# Patient Record
Sex: Female | Born: 1955 | Race: Black or African American | Hispanic: No | Marital: Married | Smoking: Never smoker
Health system: Southern US, Community
[De-identification: ages and names within clinical notes are randomized; demographics above are authoritative.]

## PROBLEM LIST (undated history)

## (undated) DIAGNOSIS — D869 Sarcoidosis, unspecified: Secondary | ICD-10-CM

## (undated) DIAGNOSIS — K529 Noninfective gastroenteritis and colitis, unspecified: Secondary | ICD-10-CM

## (undated) DIAGNOSIS — Z01419 Encounter for gynecological examination (general) (routine) without abnormal findings: Secondary | ICD-10-CM

## (undated) DIAGNOSIS — I10 Essential (primary) hypertension: Secondary | ICD-10-CM

## (undated) DIAGNOSIS — M4712 Other spondylosis with myelopathy, cervical region: Secondary | ICD-10-CM

## (undated) DIAGNOSIS — F329 Major depressive disorder, single episode, unspecified: Secondary | ICD-10-CM

## (undated) DIAGNOSIS — Z0100 Encounter for examination of eyes and vision without abnormal findings: Secondary | ICD-10-CM

## (undated) DIAGNOSIS — F32A Depression, unspecified: Secondary | ICD-10-CM

## (undated) DIAGNOSIS — K219 Gastro-esophageal reflux disease without esophagitis: Secondary | ICD-10-CM

## (undated) DIAGNOSIS — F419 Anxiety disorder, unspecified: Secondary | ICD-10-CM

## (undated) HISTORY — PX: TOTAL ABDOMINAL HYSTERECTOMY: SHX209

## (undated) HISTORY — DX: Gastro-esophageal reflux disease without esophagitis: K21.9

## (undated) HISTORY — DX: Sarcoidosis, unspecified: D86.9

## (undated) HISTORY — DX: Essential (primary) hypertension: I10

## (undated) HISTORY — DX: Major depressive disorder, single episode, unspecified: F32.9

## (undated) HISTORY — DX: Depression, unspecified: F32.A

## (undated) HISTORY — DX: Encounter for examination of eyes and vision without abnormal findings: Z01.00

## (undated) HISTORY — DX: Anxiety disorder, unspecified: F41.9

## (undated) HISTORY — DX: Noninfective gastroenteritis and colitis, unspecified: K52.9

## (undated) HISTORY — DX: Other spondylosis with myelopathy, cervical region: M47.12

## (undated) HISTORY — DX: Encounter for gynecological examination (general) (routine) without abnormal findings: Z01.419

## (undated) HISTORY — PX: OOPHORECTOMY: SHX86

---

## 2000-10-22 ENCOUNTER — Encounter: Payer: Self-pay | Admitting: Family Medicine

## 2000-10-22 ENCOUNTER — Encounter: Admission: RE | Admit: 2000-10-22 | Discharge: 2000-10-22 | Payer: Self-pay | Admitting: Family Medicine

## 2000-10-23 ENCOUNTER — Encounter: Admission: RE | Admit: 2000-10-23 | Discharge: 2000-10-23 | Payer: Self-pay | Admitting: Family Medicine

## 2000-10-23 ENCOUNTER — Encounter: Payer: Self-pay | Admitting: Family Medicine

## 2000-11-11 ENCOUNTER — Encounter: Payer: Self-pay | Admitting: General Surgery

## 2000-11-11 ENCOUNTER — Ambulatory Visit (HOSPITAL_COMMUNITY): Admission: RE | Admit: 2000-11-11 | Discharge: 2000-11-11 | Payer: Self-pay | Admitting: General Surgery

## 2000-11-13 ENCOUNTER — Encounter: Payer: Self-pay | Admitting: Emergency Medicine

## 2000-11-13 ENCOUNTER — Ambulatory Visit (HOSPITAL_COMMUNITY): Admission: RE | Admit: 2000-11-13 | Discharge: 2000-11-13 | Payer: Self-pay | Admitting: General Surgery

## 2000-11-13 ENCOUNTER — Emergency Department (HOSPITAL_COMMUNITY): Admission: EM | Admit: 2000-11-13 | Discharge: 2000-11-13 | Payer: Self-pay | Admitting: Emergency Medicine

## 2000-11-13 ENCOUNTER — Encounter: Payer: Self-pay | Admitting: General Surgery

## 2000-11-20 ENCOUNTER — Encounter (INDEPENDENT_AMBULATORY_CARE_PROVIDER_SITE_OTHER): Payer: Self-pay | Admitting: Specialist

## 2000-11-20 ENCOUNTER — Ambulatory Visit: Admission: RE | Admit: 2000-11-20 | Discharge: 2000-11-20 | Payer: Self-pay | Admitting: Internal Medicine

## 2000-11-20 ENCOUNTER — Encounter (INDEPENDENT_AMBULATORY_CARE_PROVIDER_SITE_OTHER): Payer: Self-pay

## 2000-12-03 ENCOUNTER — Ambulatory Visit (HOSPITAL_COMMUNITY): Admission: RE | Admit: 2000-12-03 | Discharge: 2000-12-03 | Payer: Self-pay | Admitting: Cardiology

## 2001-02-04 ENCOUNTER — Other Ambulatory Visit: Admission: RE | Admit: 2001-02-04 | Discharge: 2001-02-04 | Payer: Self-pay | Admitting: Gynecology

## 2003-01-27 ENCOUNTER — Other Ambulatory Visit: Admission: RE | Admit: 2003-01-27 | Discharge: 2003-01-27 | Payer: Self-pay | Admitting: Gynecology

## 2003-02-07 ENCOUNTER — Observation Stay (HOSPITAL_COMMUNITY): Admission: AD | Admit: 2003-02-07 | Discharge: 2003-02-08 | Payer: Self-pay | Admitting: Gynecology

## 2003-02-07 ENCOUNTER — Encounter: Payer: Self-pay | Admitting: Gynecology

## 2003-02-15 ENCOUNTER — Ambulatory Visit (HOSPITAL_COMMUNITY): Admission: RE | Admit: 2003-02-15 | Discharge: 2003-02-15 | Payer: Self-pay | Admitting: Gynecology

## 2003-02-15 ENCOUNTER — Encounter: Payer: Self-pay | Admitting: Gynecology

## 2003-03-01 ENCOUNTER — Encounter (INDEPENDENT_AMBULATORY_CARE_PROVIDER_SITE_OTHER): Payer: Self-pay | Admitting: Specialist

## 2003-03-01 ENCOUNTER — Inpatient Hospital Stay (HOSPITAL_COMMUNITY): Admission: AD | Admit: 2003-03-01 | Discharge: 2003-03-03 | Payer: Self-pay | Admitting: Gynecology

## 2004-03-06 ENCOUNTER — Other Ambulatory Visit: Admission: RE | Admit: 2004-03-06 | Discharge: 2004-03-06 | Payer: Self-pay | Admitting: Gynecology

## 2004-03-08 ENCOUNTER — Encounter: Admission: RE | Admit: 2004-03-08 | Discharge: 2004-03-08 | Payer: Self-pay | Admitting: Gynecology

## 2004-03-19 ENCOUNTER — Encounter: Admission: RE | Admit: 2004-03-19 | Discharge: 2004-03-19 | Payer: Self-pay | Admitting: Gynecology

## 2004-05-02 ENCOUNTER — Encounter: Admission: RE | Admit: 2004-05-02 | Discharge: 2004-05-02 | Payer: Self-pay | Admitting: General Surgery

## 2004-05-03 ENCOUNTER — Encounter: Admission: RE | Admit: 2004-05-03 | Discharge: 2004-05-03 | Payer: Self-pay | Admitting: General Surgery

## 2004-05-03 ENCOUNTER — Ambulatory Visit (HOSPITAL_BASED_OUTPATIENT_CLINIC_OR_DEPARTMENT_OTHER): Admission: RE | Admit: 2004-05-03 | Discharge: 2004-05-03 | Payer: Self-pay | Admitting: General Surgery

## 2004-05-03 ENCOUNTER — Encounter (INDEPENDENT_AMBULATORY_CARE_PROVIDER_SITE_OTHER): Payer: Self-pay | Admitting: *Deleted

## 2004-05-03 ENCOUNTER — Ambulatory Visit (HOSPITAL_COMMUNITY): Admission: RE | Admit: 2004-05-03 | Discharge: 2004-05-03 | Payer: Self-pay | Admitting: General Surgery

## 2004-07-03 ENCOUNTER — Encounter: Admission: RE | Admit: 2004-07-03 | Discharge: 2004-07-03 | Payer: Self-pay | Admitting: Unknown Physician Specialty

## 2004-08-31 ENCOUNTER — Encounter: Admission: RE | Admit: 2004-08-31 | Discharge: 2004-08-31 | Payer: Self-pay | Admitting: Unknown Physician Specialty

## 2005-04-08 ENCOUNTER — Ambulatory Visit: Payer: Self-pay | Admitting: Internal Medicine

## 2005-04-30 ENCOUNTER — Ambulatory Visit: Payer: Self-pay | Admitting: Internal Medicine

## 2005-05-14 ENCOUNTER — Other Ambulatory Visit: Admission: RE | Admit: 2005-05-14 | Discharge: 2005-05-14 | Payer: Self-pay | Admitting: Gynecology

## 2006-01-13 ENCOUNTER — Ambulatory Visit: Payer: Self-pay | Admitting: Family Medicine

## 2006-02-11 ENCOUNTER — Ambulatory Visit: Payer: Self-pay | Admitting: Family Medicine

## 2006-02-28 ENCOUNTER — Ambulatory Visit: Payer: Self-pay | Admitting: Gastroenterology

## 2006-03-12 ENCOUNTER — Ambulatory Visit: Payer: Self-pay | Admitting: Gastroenterology

## 2006-05-19 ENCOUNTER — Ambulatory Visit: Payer: Self-pay | Admitting: Family Medicine

## 2006-08-12 ENCOUNTER — Ambulatory Visit: Payer: Self-pay | Admitting: Family Medicine

## 2006-12-22 ENCOUNTER — Ambulatory Visit: Payer: Self-pay | Admitting: Family Medicine

## 2007-01-08 ENCOUNTER — Other Ambulatory Visit: Admission: RE | Admit: 2007-01-08 | Discharge: 2007-01-08 | Payer: Self-pay | Admitting: Gynecology

## 2007-01-19 ENCOUNTER — Encounter: Admission: RE | Admit: 2007-01-19 | Discharge: 2007-01-19 | Payer: Self-pay | Admitting: Family Medicine

## 2007-01-21 ENCOUNTER — Telehealth: Payer: Self-pay | Admitting: Family Medicine

## 2007-04-07 ENCOUNTER — Encounter: Admission: RE | Admit: 2007-04-07 | Discharge: 2007-04-07 | Payer: Self-pay | Admitting: Family Medicine

## 2007-04-08 ENCOUNTER — Telehealth: Payer: Self-pay | Admitting: Family Medicine

## 2007-06-18 HISTORY — PX: OTHER SURGICAL HISTORY: SHX169

## 2007-08-04 ENCOUNTER — Ambulatory Visit: Payer: Self-pay | Admitting: Family Medicine

## 2007-08-04 DIAGNOSIS — B373 Candidiasis of vulva and vagina: Secondary | ICD-10-CM | POA: Insufficient documentation

## 2007-08-04 DIAGNOSIS — N643 Galactorrhea not associated with childbirth: Secondary | ICD-10-CM | POA: Insufficient documentation

## 2007-08-04 DIAGNOSIS — F329 Major depressive disorder, single episode, unspecified: Secondary | ICD-10-CM | POA: Insufficient documentation

## 2007-08-04 DIAGNOSIS — F418 Other specified anxiety disorders: Secondary | ICD-10-CM | POA: Insufficient documentation

## 2007-08-04 DIAGNOSIS — I10 Essential (primary) hypertension: Secondary | ICD-10-CM | POA: Insufficient documentation

## 2007-08-04 LAB — CONVERTED CEMR LAB
Bilirubin Urine: NEGATIVE
Blood in Urine, dipstick: NEGATIVE
Glucose, Urine, Semiquant: NEGATIVE
Ketones, urine, test strip: NEGATIVE
Nitrite: NEGATIVE
Protein, U semiquant: NEGATIVE
Specific Gravity, Urine: 1.01
Urobilinogen, UA: 0.2
pH: 6.5

## 2007-09-18 ENCOUNTER — Telehealth: Payer: Self-pay | Admitting: Family Medicine

## 2008-01-20 ENCOUNTER — Encounter: Payer: Self-pay | Admitting: Family Medicine

## 2008-02-04 ENCOUNTER — Encounter: Payer: Self-pay | Admitting: Family Medicine

## 2008-03-08 ENCOUNTER — Ambulatory Visit: Payer: Self-pay | Admitting: Family Medicine

## 2008-03-08 DIAGNOSIS — D869 Sarcoidosis, unspecified: Secondary | ICD-10-CM | POA: Insufficient documentation

## 2008-03-24 ENCOUNTER — Telehealth: Payer: Self-pay | Admitting: Gastroenterology

## 2008-03-24 ENCOUNTER — Ambulatory Visit: Payer: Self-pay | Admitting: Internal Medicine

## 2008-03-24 DIAGNOSIS — K625 Hemorrhage of anus and rectum: Secondary | ICD-10-CM | POA: Insufficient documentation

## 2008-03-24 DIAGNOSIS — K5730 Diverticulosis of large intestine without perforation or abscess without bleeding: Secondary | ICD-10-CM | POA: Insufficient documentation

## 2008-03-28 LAB — CONVERTED CEMR LAB
BUN: 26 mg/dL — ABNORMAL HIGH (ref 6–23)
Basophils Absolute: 0 10*3/uL (ref 0.0–0.1)
Basophils Relative: 0.6 % (ref 0.0–3.0)
CO2: 28 meq/L (ref 19–32)
Calcium: 9.9 mg/dL (ref 8.4–10.5)
Chloride: 103 meq/L (ref 96–112)
Creatinine, Ser: 1.4 mg/dL — ABNORMAL HIGH (ref 0.4–1.2)
Eosinophils Absolute: 0.1 10*3/uL (ref 0.0–0.7)
Eosinophils Relative: 2.1 % (ref 0.0–5.0)
GFR calc Af Amer: 51 mL/min
GFR calc non Af Amer: 42 mL/min
Glucose, Bld: 93 mg/dL (ref 70–99)
HCT: 38.1 % (ref 36.0–46.0)
Hemoglobin: 13.1 g/dL (ref 12.0–15.0)
Lymphocytes Relative: 20.2 % (ref 12.0–46.0)
MCHC: 34.5 g/dL (ref 30.0–36.0)
MCV: 83.1 fL (ref 78.0–100.0)
Monocytes Absolute: 0.8 10*3/uL (ref 0.1–1.0)
Monocytes Relative: 12.3 % — ABNORMAL HIGH (ref 3.0–12.0)
Neutro Abs: 4 10*3/uL (ref 1.4–7.7)
Neutrophils Relative %: 64.8 % (ref 43.0–77.0)
Platelets: 212 10*3/uL (ref 150–400)
Potassium: 3.6 meq/L (ref 3.5–5.1)
RBC: 4.58 M/uL (ref 3.87–5.11)
RDW: 12.9 % (ref 11.5–14.6)
Sodium: 140 meq/L (ref 135–145)
WBC: 6.2 10*3/uL (ref 4.5–10.5)

## 2008-04-06 ENCOUNTER — Ambulatory Visit: Payer: Self-pay | Admitting: Gastroenterology

## 2008-04-06 ENCOUNTER — Telehealth: Payer: Self-pay | Admitting: Gastroenterology

## 2008-04-06 LAB — CONVERTED CEMR LAB
BUN: 25 mg/dL — ABNORMAL HIGH (ref 6–23)
CO2: 33 meq/L — ABNORMAL HIGH (ref 19–32)
Calcium: 10.3 mg/dL (ref 8.4–10.5)
Chloride: 101 meq/L (ref 96–112)
Creatinine, Ser: 1.2 mg/dL (ref 0.4–1.2)
GFR calc Af Amer: 60 mL/min
GFR calc non Af Amer: 50 mL/min
Glucose, Bld: 97 mg/dL (ref 70–99)
Potassium: 4.1 meq/L (ref 3.5–5.1)
Sodium: 141 meq/L (ref 135–145)

## 2008-04-07 ENCOUNTER — Ambulatory Visit: Payer: Self-pay | Admitting: Cardiology

## 2008-04-08 ENCOUNTER — Ambulatory Visit: Payer: Self-pay | Admitting: Gastroenterology

## 2008-04-12 ENCOUNTER — Encounter: Payer: Self-pay | Admitting: Gastroenterology

## 2008-04-12 ENCOUNTER — Ambulatory Visit: Payer: Self-pay | Admitting: Gastroenterology

## 2008-04-15 ENCOUNTER — Encounter: Payer: Self-pay | Admitting: Gastroenterology

## 2008-04-28 ENCOUNTER — Ambulatory Visit: Payer: Self-pay | Admitting: Critical Care Medicine

## 2008-04-28 DIAGNOSIS — K219 Gastro-esophageal reflux disease without esophagitis: Secondary | ICD-10-CM | POA: Insufficient documentation

## 2008-05-04 ENCOUNTER — Ambulatory Visit: Payer: Self-pay | Admitting: Gastroenterology

## 2008-05-23 ENCOUNTER — Ambulatory Visit: Payer: Self-pay | Admitting: Gastroenterology

## 2008-07-01 ENCOUNTER — Ambulatory Visit: Payer: Self-pay | Admitting: Critical Care Medicine

## 2008-07-01 DIAGNOSIS — J310 Chronic rhinitis: Secondary | ICD-10-CM | POA: Insufficient documentation

## 2008-08-29 ENCOUNTER — Telehealth: Payer: Self-pay | Admitting: Family Medicine

## 2008-08-30 ENCOUNTER — Ambulatory Visit: Payer: Self-pay | Admitting: Family Medicine

## 2008-09-05 ENCOUNTER — Encounter: Payer: Self-pay | Admitting: Family Medicine

## 2008-10-18 ENCOUNTER — Encounter (INDEPENDENT_AMBULATORY_CARE_PROVIDER_SITE_OTHER): Payer: Self-pay | Admitting: *Deleted

## 2008-11-03 ENCOUNTER — Ambulatory Visit: Payer: Self-pay | Admitting: Family Medicine

## 2008-11-04 ENCOUNTER — Encounter: Payer: Self-pay | Admitting: Family Medicine

## 2008-11-04 LAB — CONVERTED CEMR LAB
ALT: 34 units/L (ref 0–35)
AST: 31 units/L (ref 0–37)
Albumin: 3.9 g/dL (ref 3.5–5.2)
Alkaline Phosphatase: 128 units/L — ABNORMAL HIGH (ref 39–117)
BUN: 19 mg/dL (ref 6–23)
Basophils Absolute: 0 10*3/uL (ref 0.0–0.1)
Basophils Relative: 0.5 % (ref 0.0–3.0)
Bilirubin, Direct: 0.1 mg/dL (ref 0.0–0.3)
CO2: 31 meq/L (ref 19–32)
Calcium: 9.5 mg/dL (ref 8.4–10.5)
Chloride: 105 meq/L (ref 96–112)
Creatinine, Ser: 1.1 mg/dL (ref 0.4–1.2)
Eosinophils Absolute: 0.2 10*3/uL (ref 0.0–0.7)
Eosinophils Relative: 3.5 % (ref 0.0–5.0)
GFR calc non Af Amer: 66.54 mL/min (ref >60)
Glucose, Bld: 81 mg/dL (ref 70–99)
HCT: 39.3 % (ref 36.0–46.0)
Hemoglobin: 13.7 g/dL (ref 12.0–15.0)
Lymphocytes Relative: 26 % (ref 12.0–46.0)
Lymphs Abs: 1.2 10*3/uL (ref 0.7–4.0)
MCHC: 34.9 g/dL (ref 30.0–36.0)
MCV: 85.2 fL (ref 78.0–100.0)
Monocytes Absolute: 0.5 10*3/uL (ref 0.1–1.0)
Monocytes Relative: 10.3 % (ref 3.0–12.0)
Neutro Abs: 2.8 10*3/uL (ref 1.4–7.7)
Neutrophils Relative %: 59.7 % (ref 43.0–77.0)
Platelets: 187 10*3/uL (ref 150.0–400.0)
Potassium: 3.5 meq/L (ref 3.5–5.1)
RBC: 4.62 M/uL (ref 3.87–5.11)
RDW: 14.6 % (ref 11.5–14.6)
Sodium: 143 meq/L (ref 135–145)
TSH: 0.89 microintl units/mL (ref 0.35–5.50)
Total Bilirubin: 0.6 mg/dL (ref 0.3–1.2)
Total Protein: 7.3 g/dL (ref 6.0–8.3)
Vitamin B-12: 457 pg/mL (ref 211–911)
WBC: 4.7 10*3/uL (ref 4.5–10.5)

## 2008-12-07 ENCOUNTER — Encounter: Payer: Self-pay | Admitting: Family Medicine

## 2009-02-07 ENCOUNTER — Ambulatory Visit: Payer: Self-pay | Admitting: Family Medicine

## 2009-02-07 DIAGNOSIS — R609 Edema, unspecified: Secondary | ICD-10-CM | POA: Insufficient documentation

## 2009-02-21 ENCOUNTER — Ambulatory Visit: Payer: Self-pay | Admitting: Family Medicine

## 2009-02-21 ENCOUNTER — Encounter: Admission: RE | Admit: 2009-02-21 | Discharge: 2009-02-21 | Payer: Self-pay | Admitting: Family Medicine

## 2009-02-21 LAB — CONVERTED CEMR LAB
Bilirubin Urine: NEGATIVE
Blood in Urine, dipstick: NEGATIVE
Glucose, Urine, Semiquant: NEGATIVE
Ketones, urine, test strip: NEGATIVE
Nitrite: NEGATIVE
Specific Gravity, Urine: 1.025
Urobilinogen, UA: 0.2
WBC Urine, dipstick: NEGATIVE
pH: 5.5

## 2009-02-22 ENCOUNTER — Encounter: Payer: Self-pay | Admitting: Family Medicine

## 2009-02-22 LAB — CONVERTED CEMR LAB
ALT: 36 units/L — ABNORMAL HIGH (ref 0–35)
AST: 33 units/L (ref 0–37)
Albumin: 3.7 g/dL (ref 3.5–5.2)
Alkaline Phosphatase: 143 units/L — ABNORMAL HIGH (ref 39–117)
BUN: 20 mg/dL (ref 6–23)
Basophils Absolute: 0 10*3/uL (ref 0.0–0.1)
Basophils Relative: 0.7 % (ref 0.0–3.0)
Bilirubin, Direct: 0.1 mg/dL (ref 0.0–0.3)
CO2: 30 meq/L (ref 19–32)
Calcium: 9.3 mg/dL (ref 8.4–10.5)
Chloride: 108 meq/L (ref 96–112)
Creatinine, Ser: 1.1 mg/dL (ref 0.4–1.2)
Eosinophils Absolute: 0.2 10*3/uL (ref 0.0–0.7)
Eosinophils Relative: 4.5 % (ref 0.0–5.0)
GFR calc non Af Amer: 66.46 mL/min (ref >60)
Glucose, Bld: 102 mg/dL — ABNORMAL HIGH (ref 70–99)
HCT: 36.9 % (ref 36.0–46.0)
Hemoglobin: 12.5 g/dL (ref 12.0–15.0)
Lymphocytes Relative: 20.3 % (ref 12.0–46.0)
Lymphs Abs: 0.9 10*3/uL (ref 0.7–4.0)
MCHC: 33.8 g/dL (ref 30.0–36.0)
MCV: 87.9 fL (ref 78.0–100.0)
Monocytes Absolute: 0.6 10*3/uL (ref 0.1–1.0)
Monocytes Relative: 14 % — ABNORMAL HIGH (ref 3.0–12.0)
Neutro Abs: 2.5 10*3/uL (ref 1.4–7.7)
Neutrophils Relative %: 60.5 % (ref 43.0–77.0)
Platelets: 181 10*3/uL (ref 150.0–400.0)
Potassium: 3.5 meq/L (ref 3.5–5.1)
Pro B Natriuretic peptide (BNP): 22 pg/mL (ref 0.0–100.0)
RBC: 4.2 M/uL (ref 3.87–5.11)
RDW: 13.2 % (ref 11.5–14.6)
Sodium: 144 meq/L (ref 135–145)
TSH: 1.74 microintl units/mL (ref 0.35–5.50)
Total Bilirubin: 0.8 mg/dL (ref 0.3–1.2)
Total Protein: 6.7 g/dL (ref 6.0–8.3)
WBC: 4.2 10*3/uL — ABNORMAL LOW (ref 4.5–10.5)

## 2009-02-27 ENCOUNTER — Ambulatory Visit: Payer: Self-pay | Admitting: Family Medicine

## 2009-03-08 ENCOUNTER — Ambulatory Visit: Payer: Self-pay | Admitting: Family Medicine

## 2009-03-10 ENCOUNTER — Encounter: Payer: Self-pay | Admitting: Family Medicine

## 2009-03-21 ENCOUNTER — Encounter: Payer: Self-pay | Admitting: Family Medicine

## 2009-07-04 ENCOUNTER — Ambulatory Visit: Payer: Self-pay | Admitting: Gynecology

## 2009-07-04 ENCOUNTER — Other Ambulatory Visit: Admission: RE | Admit: 2009-07-04 | Discharge: 2009-07-04 | Payer: Self-pay | Admitting: Gynecology

## 2009-08-08 ENCOUNTER — Ambulatory Visit: Payer: Self-pay | Admitting: Gastroenterology

## 2009-08-08 LAB — CONVERTED CEMR LAB
Basophils Absolute: 0 10*3/uL (ref 0.0–0.1)
Basophils Relative: 0.5 % (ref 0.0–3.0)
Eosinophils Absolute: 0.1 10*3/uL (ref 0.0–0.7)
Eosinophils Relative: 1.8 % (ref 0.0–5.0)
HCT: 41.4 % (ref 36.0–46.0)
Hemoglobin: 13.4 g/dL (ref 12.0–15.0)
Lymphocytes Relative: 22.1 % (ref 12.0–46.0)
Lymphs Abs: 1.5 10*3/uL (ref 0.7–4.0)
MCHC: 32.4 g/dL (ref 30.0–36.0)
MCV: 84.4 fL (ref 78.0–100.0)
Monocytes Absolute: 0.7 10*3/uL (ref 0.1–1.0)
Monocytes Relative: 11 % (ref 3.0–12.0)
Neutro Abs: 4.4 10*3/uL (ref 1.4–7.7)
Neutrophils Relative %: 64.6 % (ref 43.0–77.0)
Platelets: 229 10*3/uL (ref 150.0–400.0)
RBC: 4.91 M/uL (ref 3.87–5.11)
RDW: 14.9 % — ABNORMAL HIGH (ref 11.5–14.6)
WBC: 6.7 10*3/uL (ref 4.5–10.5)

## 2009-08-09 ENCOUNTER — Ambulatory Visit: Payer: Self-pay | Admitting: Gastroenterology

## 2009-08-09 HISTORY — PX: COLONOSCOPY: SHX174

## 2009-09-26 ENCOUNTER — Ambulatory Visit: Payer: Self-pay | Admitting: Family Medicine

## 2009-10-16 IMAGING — CT CT PARANASAL SINUSES LIMITED
1 series · 16 of 20 positions shown, 20 images · IV contrast (agent unspecified)
Comparison: CT sinus, 04/30/05.

CLINICAL DATA: Recurrent sinusitis symptoms, four rounds of antibiotics.  Headaches, sinus drainage, facial swelling.
LIMITED CT OF THE PARANASAL SINUSES WITHOUT CONTRAST:
TECHNIQUE: Limited coronal CT images were obtained through the paranasal sinuses without intravenous contrast.

[Series 2: limited sinus prone · axial · 0.33mm/px · z∈[+48,+133]mm · 16 of 20 slices shown, 20 images]
[im 2/20  brain]
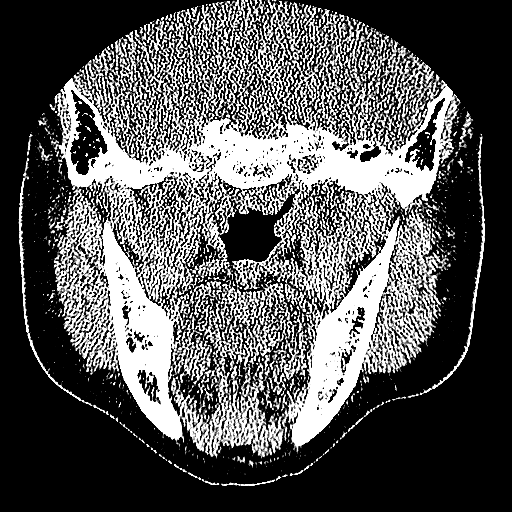
[im 2/20  bone]
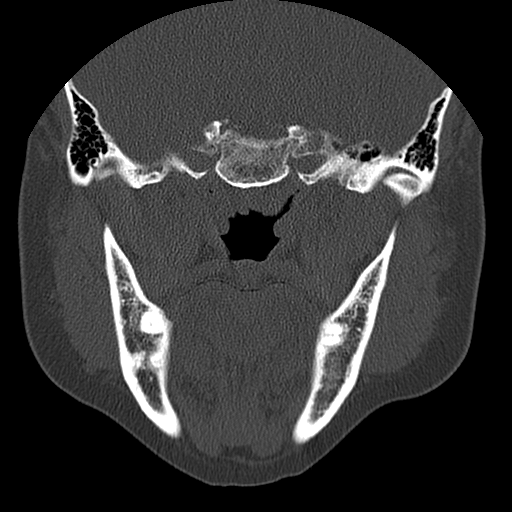
[im 3/20  bone]
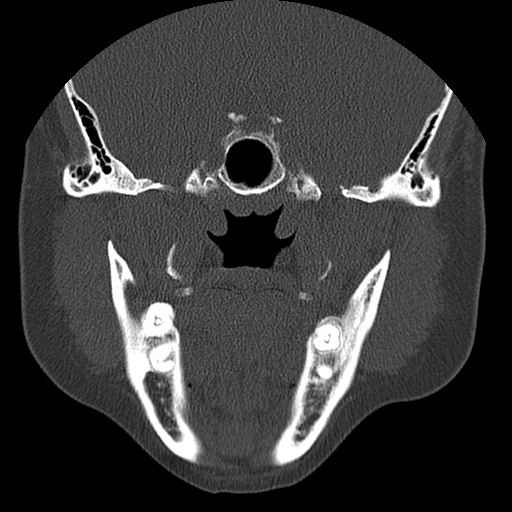
[im 4/20  bone]
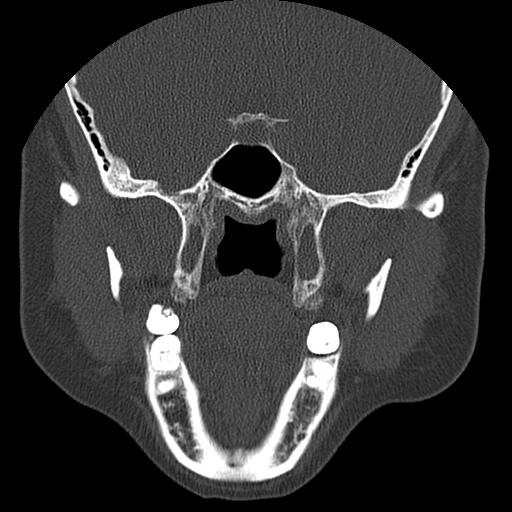
[im 5/20  bone]
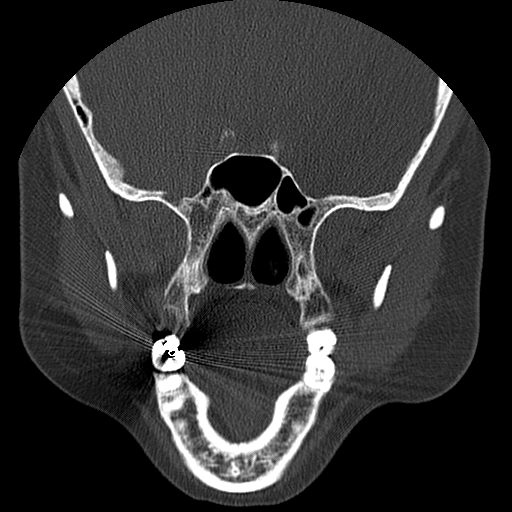
[im 7/20  brain]
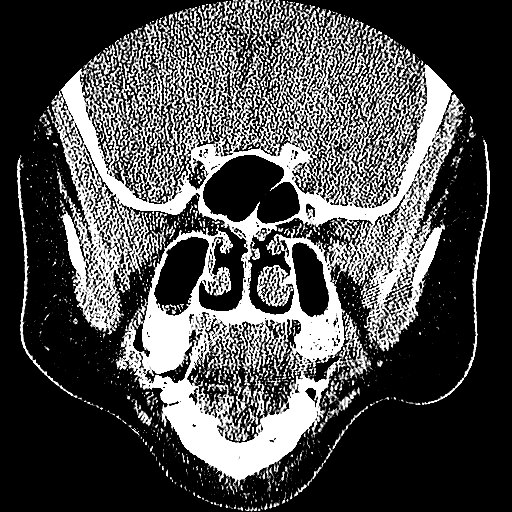
[im 7/20  bone]
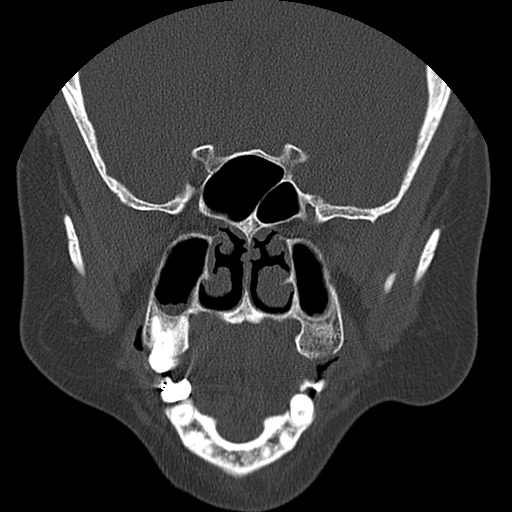
[im 8/20  bone]
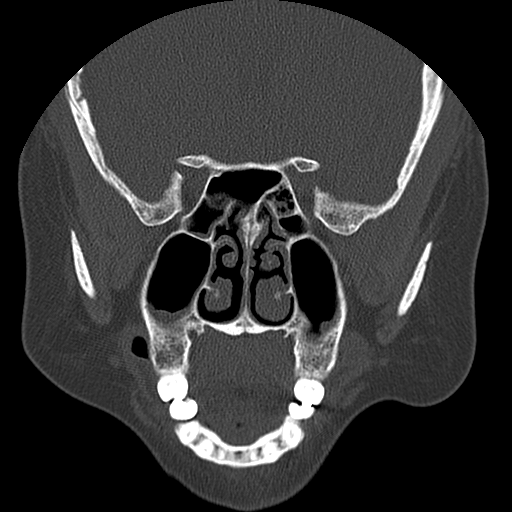
[im 9/20  bone]
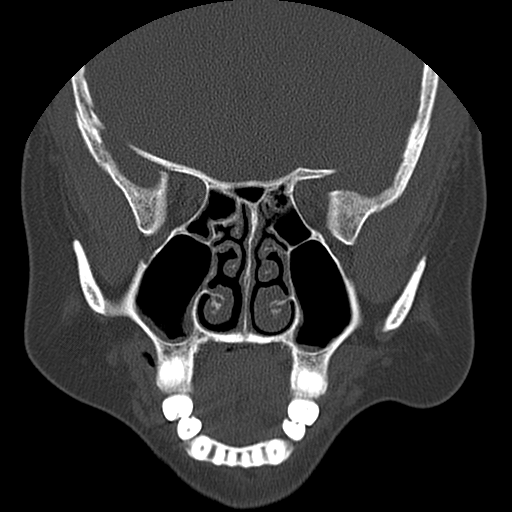
[im 10/20  bone]
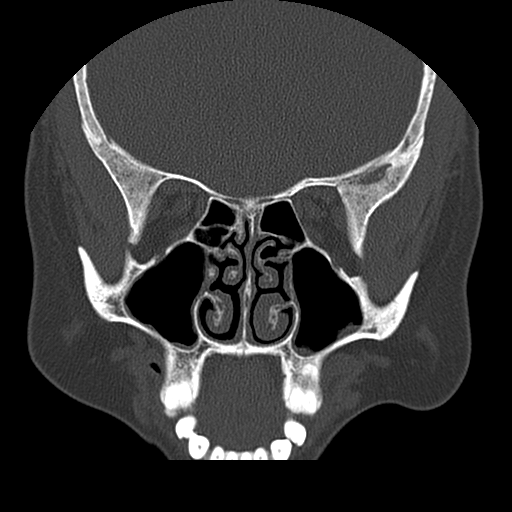
[im 11/20  brain]
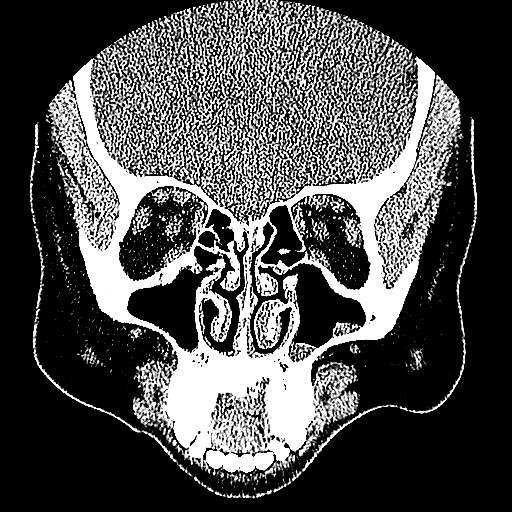
[im 11/20  bone]
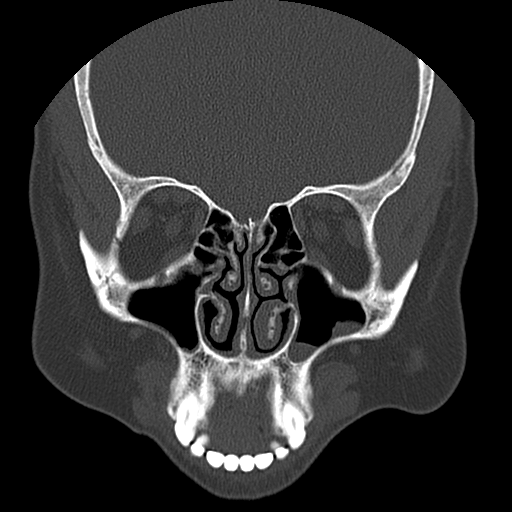
[im 12/20  bone]
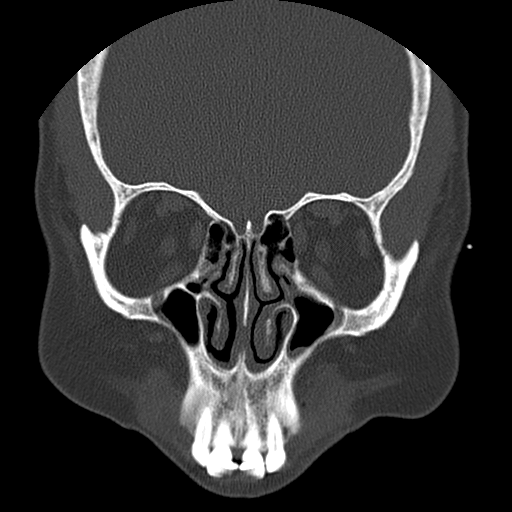
[im 13/20  bone]
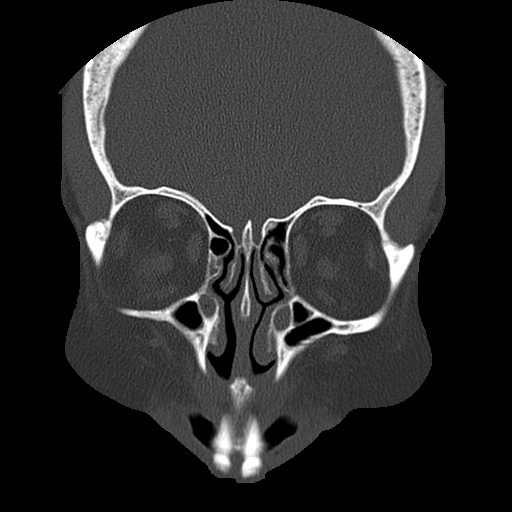
[im 14/20  bone]
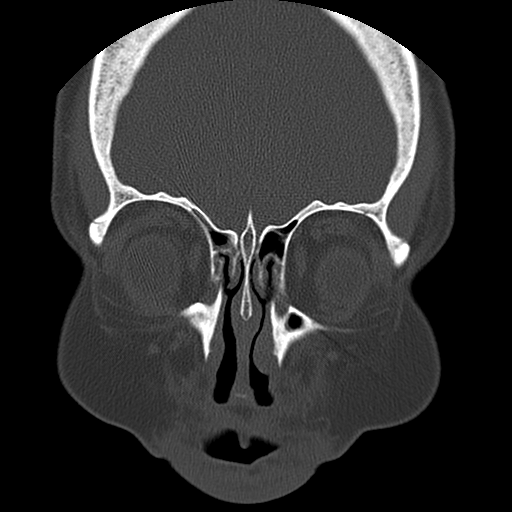
[im 16/20  brain]
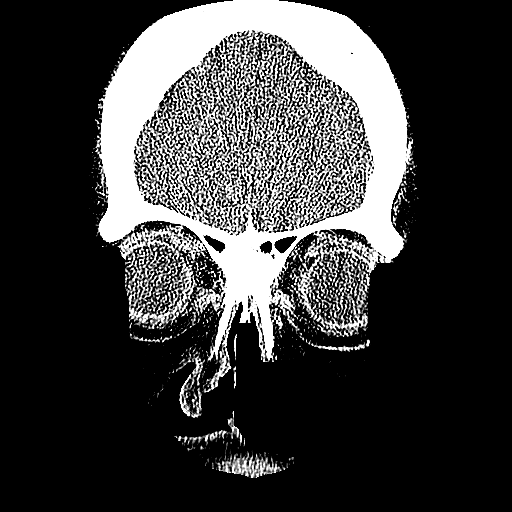
[im 16/20  bone]
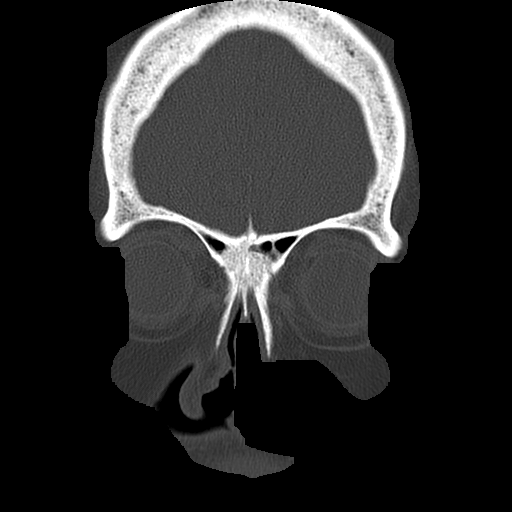
[im 17/20  bone]
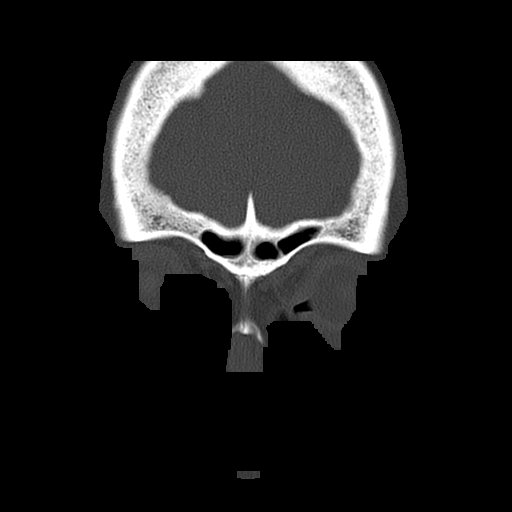
[im 18/20  bone]
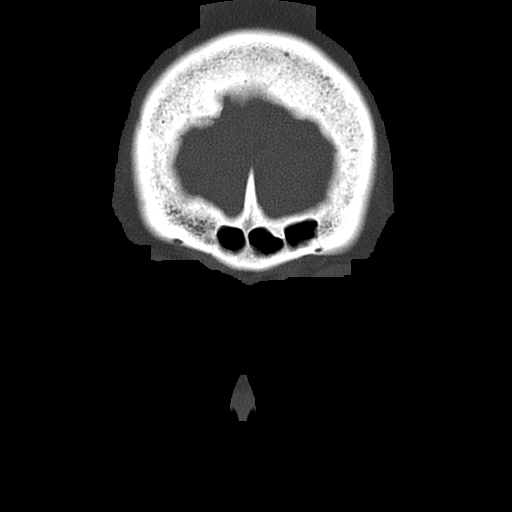
[im 19/20  bone]
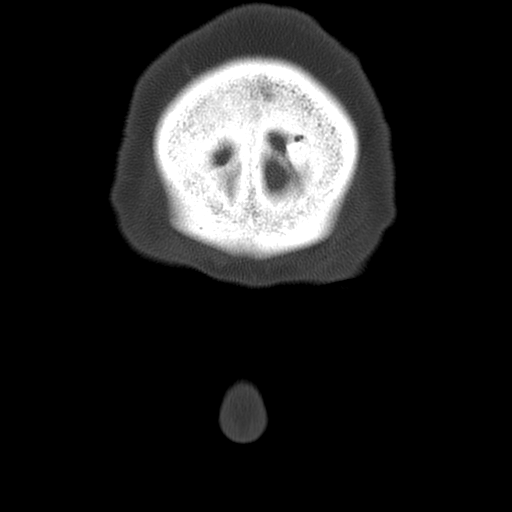

[16 of 20 positions shown; findings below may reference images not displayed]

FINDINGS: inimal chronic mucosal thickening is seen at the inferior aspect of bilateral maxillary antra.  Paranasal sinuses are otherwise clear.  Mild 3 mm mucosal thickening is seen at the inferior nasal cavity with slight inflammatory enlargement, right inferior nasal turbinate.  Bilateral mastoid air cells appear clear.  No new significant abnormality is seen.
IMPRESSION: 1.  Slight inferior and right rhinitis findings.
2.  Minimal chronic mucosal thickening, inferior bilateral maxillary antra with otherwise clear paranasal sinuses.
3.  Otherwise no new significant abnormality.

## 2009-12-13 ENCOUNTER — Encounter: Payer: Self-pay | Admitting: Family Medicine

## 2010-05-08 ENCOUNTER — Telehealth: Payer: Self-pay | Admitting: Family Medicine

## 2010-07-07 ENCOUNTER — Encounter: Payer: Self-pay | Admitting: Unknown Physician Specialty

## 2010-07-09 ENCOUNTER — Encounter: Payer: Self-pay | Admitting: Family Medicine

## 2010-07-17 NOTE — Assessment & Plan Note (Signed)
Summary: +HCS/YF    History of Present Illness Visit Type: Follow-up Visit Primary GI MD: Melvia Heaps MD [facility] Primary Provider: Gershon Crane, MD Requesting Provider: n/a Chief Complaint: hem + stools, patient has urgency & frequent bowel movements History of Present Illness:   Ms. Janice Bright is a pleasant 55 year old Afro-American female referred after request of Dr. Madaline Guthrie does for evaluation of Hemoccult positive stools.  This was noted on routine exam.  She has no GI complaints including change of bowel habits, abdominal pain, melena or hematochezia. She has noted very dark stools at times.  In October, 2009 she underwent sigmoidoscopy because of persistent diarrhea following antibiotic use.  A nonspecific colitis was noted and she was treated with both Flagyl and lialda.  Stools were negative for C. difficile toxin.  Symptoms resolved and she discontinued both medications.  Colonoscopy in 2007 showed diverticulosis.  She is on no regular gastric irritants including nonsteroidals.   GI Review of Systems      Denies abdominal pain, acid reflux, belching, bloating, chest pain, dysphagia with liquids, dysphagia with solids, heartburn, loss of appetite, nausea, vomiting, vomiting blood, weight loss, and  weight gain.      Reports change in bowel habits and  heme positive stool.     Denies anal fissure, black tarry stools, constipation, diarrhea, diverticulosis, fecal incontinence, hemorrhoids, irritable bowel syndrome, jaundice, light color stool, liver problems, rectal bleeding, and  rectal pain.    Current Medications (verified): 1)  Multivitamins   Tabs (Multiple Vitamin) .Marland Kitchen.. 1 By Mouth Once Daily 2)  Afrin Saline Nasal Mist 0.65 %  Soln (Saline) .... As Needed 3)  Calcium 600/vitamin D 600-400 Mg-Unit Chew (Calcium Carbonate-Vitamin D) .... Once Daily 4)  Restasis 0.05 % Emul (Cyclosporine) .... Once Daily 5)  Vitamin D [address] Unit Caps (Ergocalciferol) .Marland Kitchen.. 1 By Mouth Every 3 Days 6)   Blood Pressure Med .... Take 1 Tablet By Mouth Once Daily 7)  Hormone Medication .... Take 1tablet By Mouth Once Daily 8)  Nasonex 50 Mcg/act Susp (Mometasone Furoate) .... Use Two Times A Day 9)  Astelin 137 Mcg/spray Soln (Azelastine Hcl) .... Sprayin Each Nostril Two Times A Day  Allergies (verified): No Known Drug Allergies  Past History:  Past Medical History: Reviewed history from 08/30/2008 and no changes required. Depression Hypertension sarcoidosis with lung involvement, sees Dr. Delford Field    -FeV1 95% TLC 93% DLCO/Va 100% 2009    -CXR NAD 209 Colitis , sees Dr. Arlyce Dice sees Dr. Beatrix Shipper for GYN exams sees Dr. Mitzi Davenport for eye exams      Past Surgical History: Reviewed history from 08/30/2008 and no changes required. Hysterectomy Oophorectomy benign cyst removed from the right breast 2009  Family History: Reviewed history from 04/08/2008 and no changes required. Family History Hypertension Family History of Heart Disease: Mother Family History of Diabetes: Father No FH of Colon Cancer:  Social History: Reviewed history from 04/28/2008 and no changes required. 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  The patient denies anemia, arthritis/joint pain, back pain, breast changes/lumps, confusion, coughing up blood, fainting, fever, hearing problems, heart murmur, heart rhythm changes, itching, menstrual pain, nosebleeds, pregnancy symptoms, shortness of breath, skin rash, swelling of feet/legs, swollen lymph glands, thirst -  excessive , urination - excessive , urination changes/pain, and vision changes.    Vital Signs:  Patient profile:   55 year old  female Height:      65 inches Weight:      237.25 pounds BMI:     39.62 Pulse rate:   80 / minute Pulse rhythm:   regular BP sitting:   164 / 102  (left arm) Cuff size:   regular  Vitals Entered By: June McMurray CMA Duncan Dull) (August 08, 2009 10:28 AM)  Physical Exam  Additional Exam:  She is a well-developed female  skin: anicteric HEENT: normocephalic; PEERLA; no nasal or pharyngeal abnormalities neck: supple nodes: no cervical lymphadenopathy chest: clear to ausculatation and percussion heart: no murmurs, gallops, or rubs abd: soft, nontender; BS normoactive; no abdominal masses, tenderness, organomegaly rectal: deferred ext: no cynanosis, clubbing, edema skeletal: no deformities neuro: oriented x 3; no focal abnormalities    Impression & Recommendations:  Problem # 1:  NONSPECIFIC ABNORMAL FINDING IN STOOL CONTENTS (ICD-792.1) Hemoccult-Positive stool could be due  asymptomatic colitis.  Polyps and neoplasm, and a upper GI source or other considerations.  Recommendations #1 check CBC #2 colonoscopy  Risks, alternatives, and complications of the procedure, including bleeding, perforation, and possible need for surgery, were explained to the patient.  Patient's questions were answered.  Orders: TLB-CBC Platelet - w/Differential (85025-CBCD) Colonoscopy (Colon)  Patient Instructions: 1)  Colonoscopy and Flexible Sigmoidoscopy brochure given.  2)  Conscious Sedation brochure given.  3)  Your colonoscopy is scheduled for 08/09/2009 at 8am 4)  You can pick up your Miralax prep from your pharmacy today 5)  You will go to the basement today for labs 6)  CC  Dr  Lily Peer 7)  The medication list was reviewed and reconciled.  All changed / newly prescribed medications were explained.  A complete medication list was provided to the patient / caregiver. Prescriptions: DULCOLAX 5 MG  TBEC (BISACODYL) Day before procedure take 2 at 3pm and 2 at 8pm.  #4 x 0   Entered by:    Merri Ray CMA (AAMA)   Authorized by:   Louis Meckel MD   Signed by:   Merri Ray CMA (AAMA) on 08/08/2009   Method used:   [facility] [address] [phone]* (retail)       [address]       [place], [place]  [address]       Ph: [phone] or [phone]       Fax: [phone]   RxID:   [id number] REGLAN 10 MG  TABS (METOCLOPRAMIDE HCL) As per prep instructions.  #2 x 0   Entered by:   Merri Ray CMA (AAMA)   Authorized by:   Louis Meckel MD   Signed by:   Merri Ray CMA (AAMA) on 08/08/2009   Method used:   [facility]        [facility] [address] [address] [phone]* (retail)       [address]       [place], [place]  [address]       Ph: [phone] or [phone]       Fax: [phone]   RxID:   [id number] MIRALAX   POWD (POLYETHYLENE GLYCOL 3350) As per prep  instructions.  #255gm x 0   Entered by:   Merri Ray CMA (AAMA)  Authorized by:   Louis Meckel MD   Signed by:   Merri Ray CMA (AAMA) on 08/08/2009   Method used:   [facility] to        [facility] [address] [phone]* (retail)       [address]       [place], [place]  [address]       Ph: [phone] or [phone]       Fax: [phone]   RxID:   [id number]

## 2010-07-17 NOTE — Procedures (Signed)
Summary: Colonoscopy  Patient: Janice Bright Note: All result statuses are Final unless otherwise noted.  Tests: (1) Colonoscopy (COL)   COL Colonoscopy           [facility]ONE      [facility]     [address]     [place], [place]  [address]           COLONOSCOPY PROCEDURE REPORT           PATIENT:  Janice Bright, Janice Bright  MR#:  [id number]     BIRTHDATE:  1956/05/17, 53 yrs. old  GENDER:  female           ENDOSCOPIST:  Barbette Hair. Arlyce Dice, MD     Referred by:           PROCEDURE DATE:  08/09/2009     PROCEDURE:  Colonoscopy, Diagnostic     ASA CLASS:  Class II     INDICATIONS:  heme positive stool           MEDICATIONS:   Fentanyl 75 mcg IV, Versed 6 mg IV           DESCRIPTION OF PROCEDURE:   After the risks benefits and     alternatives of the procedure were thoroughly explained, informed     consent was obtained.  Digital rectal exam was performed and     revealed no abnormalities.   The LB [id number] endoscope     was introduced through the anus and advanced to the cecum, which     was identified by both the appendix and ileocecal valve, without     limitations.  The quality of the prep was excellent, using     MoviPrep.  The instrument was then slowly withdrawn as the colon     was fully examined.     <<PROCEDUREIMAGES>>           FINDINGS:  Moderate diverticulosis was found in the sigmoid colon     (see image1 and image12).  This was otherwise a normal examination     of the colon (see image3, image4, image5, image8, image9, image11,     image14, and image15).   Retroflexed views in the rectum revealed     no abnormalities.    The scope was then withdrawn from the patient     and the procedure completed.           COMPLICATIONS:  None           ENDOSCOPIC IMPRESSION:     1) Moderate diverticulosis in the sigmoid colon     2) Otherwise normal examination           No source for heme positive stool identified           RECOMMENDATIONS:     1) followup  hemeoccults           REPEAT EXAM:  In 10 year(s) for Colonoscopy.           ______________________________     Barbette Hair. Arlyce Dice, MD           CC:  Reynaldo Minium, MD           n.     Rosalie Doctor:   Barbette Hair. Kaplan at 08/09/2009 08:56 AM           Irish Lack, [phone]  Note: An exclamation mark (!) indicates a result that was not dispersed into the flowsheet. Document Creation Date: 08/09/2009 8:56 AM _______________________________________________________________________  (1)  Order result status: Final Collection or observation date-time: 08/09/2009 08:48 Requested date-time:  Receipt date-time:  Reported date-time:  Referring Physician:   Ordering Physician: Melvia Heaps [phone]) Specimen Source:  Source: Launa Grill Order Number: [id number] Lab site:   Appended Document: Colonoscopy    Clinical Lists Changes  Observations: Added new observation of COLONNXTDUE: 07/2019 (08/09/2009 11:14)      Appended Document: Colonoscopy     Procedures Next Due Date:    Colonoscopy: 07/2019

## 2010-07-17 NOTE — Progress Notes (Signed)
Summary: rx lexapro   Phone Note From Pharmacy   [facility] Summary of Call: refill lexapro 20mg   Initial call taken by: Pura Spice, RN,  May 08, 2010 2:17 PM  Follow-up for Phone Call        she takes one a day. Call in #30 with 11 rf Follow-up by: Nelwyn Salisbury MD,  May 08, 2010 3:43 PM  Additional Follow-up for Phone Call Additional follow up Details #1::        ok this was [facility]  so is ok to send  in for 90 tabs 1 refill  Additional Follow-up by: Pura Spice, RN,  May 08, 2010 3:58 PM    Additional Follow-up for Phone Call Additional follow up Details #2::    call in #90 with 3 rf  Follow-up by: Nelwyn Salisbury MD,  May 08, 2010 4:12 PM  Additional Follow-up for Phone Call Additional follow up Details #3:: Details for Additional Follow-up Action Taken: done  Additional Follow-up by: Pura Spice, RN,  May 08, 2010 4:57 PM  New/Updated Medications: LEXAPRO 20 MG TABS (ESCITALOPRAM OXALATE) 1 by mouth once daily Prescriptions: LEXAPRO 20 MG TABS (ESCITALOPRAM OXALATE) 1 by mouth once daily  #90 x 3   Entered by:   Pura Spice, RN   Authorized by:   Nelwyn Salisbury MD   Signed by:   Pura Spice, RN on 05/08/2010   Method used:   Printed then faxed to ...       [facility]       [address] [place], [place]  [address]       Ph: [phone]       Fax: [phone]   RxID:   [id number]

## 2010-07-17 NOTE — Assessment & Plan Note (Signed)
Summary: pain down left side of body/no chest pain/breathing diff at n...   Vital Signs:  Patient profile:   55 year old female Weight:      236 pounds BMI:     39.41 Temp:     98.4 degrees F oral BP sitting:   130 / 90  (left arm)  Vitals Entered By: Raechel Ache, RN (September 26, 2009 3:49 PM) CC: C/o tingling L side and face, soreness-pressure feeling on L side since last Thurs, now has sore throat & come cough.   History of Present Illness: Here for continued symptoms of pain and tingling on the left side, but now this is predominantly in the left arm and left neck as opposed to the left trunk. Last fall she was having left trunk pains, and a thoracic spine Xray showed diffuse g=degenerative changes. She saw Dr. Gerre Pebbles, and he wanted to try NSAIDs and PT for this. However she onoy attended 2 sessions of PT before she stopped going. Now she uses aspirin at times. She can get reduced cost massages at her work, and she finds these helpful.   Allergies (verified): No Known Drug Allergies  Past History:  Past Medical History: Depression Hypertension sarcoidosis with lung involvement, sees Dr. Delford Field    -FeV1 95% TLC 93% DLCO/Va 100% 2009    -CXR NAD 209 Colitis , sees Dr. Arlyce Dice sees Dr. Beatrix Shipper for GYN exams sees Dr. Mitzi Davenport for eye exams spinal spondylosis      Past Surgical History: Reviewed history from 08/30/2008 and no changes required. Hysterectomy Oophorectomy benign cyst removed from the right breast 2009  Review of Systems  The patient denies anorexia, fever, weight loss, weight gain, vision loss, decreased hearing, hoarseness, chest pain, syncope, dyspnea on exertion, peripheral edema, prolonged cough, headaches, hemoptysis, abdominal pain, melena, hematochezia, severe indigestion/heartburn, hematuria, incontinence, genital sores, muscle weakness, suspicious skin lesions, transient blindness, difficulty walking, depression, unusual weight change,  abnormal bleeding, enlarged lymph nodes, angioedema, breast masses, and testicular masses.    Physical Exam  General:  Well-developed,well-nourished,in no acute distress; alert,appropriate and cooperative throughout examination Neck:  tender at the lower posterior neck with some spasm, ROM is full Lungs:  Normal respiratory effort, chest expands symmetrically. Lungs are clear to auscultation, no crackles or wheezes. Heart:  Normal rate and regular rhythm. S1 and S2 normal without gallop, murmur, click, rub or other extra sounds. Msk:  No deformity or scoliosis noted of thoracic or lumbar spine.     Impression & Recommendations:  Problem # 1:  NECK PAIN (ICD-723.1)  Her updated medication list for this problem includes:    Etodolac 500 Mg Tabs (Etodolac) .Marland Kitchen..Marland Kitchen Two times a day as needed pain  Complete Medication List: 1)  Multivitamins Tabs (Multiple vitamin) .Marland Kitchen.. 1 by mouth once daily 2)  Calcium 600/vitamin D 600-400 Mg-unit Chew (Calcium carbonate-vitamin d) .... Once daily 3)  Restasis 0.05 % Emul (Cyclosporine) .... Once daily 4)  Hormone Medication  .... Take 1tablet by mouth once daily 5)  Nasonex 50 Mcg/act Susp (Mometasone furoate) .... Use two times a day 6)  Astelin 137 Mcg/spray Soln (Azelastine hcl) .... Sprayin each nostril two times a day 7)  Fexofenadine Hcl 180 Mg Tabs (Fexofenadine hcl) .Marland Kitchen.. 1 once daily 8)  Etodolac 500 Mg Tabs (Etodolac) .... Two times a day as needed pain 9)  Lisinopril-hydrochlorothiazide 20-12.5 Mg Tabs (Lisinopril-hydrochlorothiazide) .... Once daily  Patient Instructions: 1)  it seems all of her current symptoms originate in the neck,  and she probably has some significant degenerative disc changes here. This leads to radicular symptoms down the arm and to tension HAs.  I encouraged her to get massages as often as she can, and we will use Etodolac to reduce inflammation.  2)  Please schedule a follow-up appointment as needed .   Prescriptions: LISINOPRIL-HYDROCHLOROTHIAZIDE 20-12.5 MG TABS (LISINOPRIL-HYDROCHLOROTHIAZIDE) once daily  #30 x 11   Entered and Authorized by:   Nelwyn Salisbury MD   Signed by:   Nelwyn Salisbury MD on 09/26/2009   Method used:   [facility] to        [facility] [address] [phone]* (retail)       [address]       [place], [place]  [address]       Ph: [phone] or [phone]       Fax: [phone]   RxID:   [id number] ETODOLAC 500 MG TABS (ETODOLAC) two times a day as needed pain  #60 x 5   Entered and Authorized by:   Nelwyn Salisbury MD   Signed by:   Nelwyn Salisbury MD on 09/26/2009   Method used:   [facility] to        [facility] [address] [phone]* (retail)       [address]       [place], [place]  [address]       Ph: [phone] or [phone]       Fax: [phone]   RxID:   [id number]

## 2010-07-17 NOTE — Letter (Signed)
Summary: [facility] Instructions  [facility]  [address] [place], [place] [address]   Phone: [phone]  Fax: [phone]       JALEYA PEBLEY    1955/07/27    MRN: [id number]       Procedure Day /Date:=WEDNESDAY 08/09/2009     Arrival Time: 7:30AM     Procedure Time:8:00AM    Location of Procedure:                    X   [facility] (4th Floor)   PREPARATION FOR COLONOSCOPY WITH MIRALAX  Starting 5 days prior to your procedure TODAY do not eat nuts, seeds, popcorn, corn, beans, peas,  salads, or any raw vegetables.  Do not take any fiber supplements (e.g. Metamucil, Citrucel, and Benefiber). ____________________________________________________________________________________________________   THE DAY BEFORE YOUR PROCEDURE         DATE:08/08/2009 DAY: TUESDAY  1   Drink clear liquids the entire day-NO SOLID FOOD  2   Do not drink anything colored red or purple.  Avoid juices with pulp.  No orange juice.  3   Drink at least 64 oz. (8 glasses) of fluid/clear liquids during the day to prevent dehydration and help the prep work efficiently.  CLEAR LIQUIDS INCLUDE: Water Jello Ice Popsicles Tea (sugar ok, no milk/cream) Powdered fruit flavored drinks Coffee (sugar ok, no milk/cream) Gatorade Juice: apple, white grape, white cranberry  Lemonade Clear bullion, consomm, broth Carbonated beverages (any kind) Strained chicken noodle soup Hard Candy  4   Mix the entire bottle of Miralax with 64 oz. of Gatorade/Powerade in the morning and put in the refrigerator to chill.  5   At 3:00 pm take 2 Dulcolax/Bisacodyl tablets.  6   At 4:30 pm take one Reglan/Metoclopramide tablet.  7  Starting at 5:00 pm drink one 8 oz glass of the Miralax mixture every 15-20 minutes until you have finished drinking the entire 64 oz.  You should finish drinking prep around 7:30 or 8:00 pm.  8   If you are nauseated, you may take the 2nd Reglan/Metoclopramide tablet at  6:30 pm.        9    At 8:00 pm take 2 more DULCOLAX/Bisacodyl tablets.     THE DAY OF YOUR PROCEDURE      DATE:  08/09/2009 DAY: Lulu Riding  You may drink clear liquids until 6AM (2 HOURS BEFORE PROCEDURE).   MEDICATION INSTRUCTIONS  Unless otherwise instructed, you should take regular prescription medications with a small sip of water as early as possible the morning of your procedure.   Additional medication instructions: _  _         OTHER INSTRUCTIONS  You will need a responsible adult at least 56 years of age to accompany you and drive you home.   This person must remain in the waiting room during your procedure.  Wear loose fitting clothing that is easily removed.  Leave jewelry and other valuables at home.  However, you may wish to bring a book to read or an iPod/MP3 player to listen to music as you wait for your procedure to start.  Remove all body piercing jewelry and leave at home.  Total time from sign-in until discharge is approximately 2-3 hours.  You should go home directly after your procedure and rest.  You can resume normal activities the day after your procedure.  The day of your procedure you should not:   Drive   Make legal  decisions   Operate machinery   Drink alcohol   Return to work  You will receive specific instructions about eating, activities and medications before you leave.   The above instructions have been reviewed and explained to me by   _______________________    I fully understand and can verbalize these instructions _____________________________ Date _______

## 2010-07-23 ENCOUNTER — Telehealth: Payer: Self-pay | Admitting: Family Medicine

## 2010-07-23 DIAGNOSIS — J029 Acute pharyngitis, unspecified: Secondary | ICD-10-CM

## 2010-07-23 MED ORDER — AZITHROMYCIN 250 MG PO TABS: ORAL_TABLET | ORAL | Status: AC

## 2010-07-23 NOTE — Telephone Encounter (Signed)
Pt returned call and [facility] Called pt at at [phone]  Informed med called in

## 2010-07-23 NOTE — Telephone Encounter (Signed)
Call in a Zpack  ?

## 2010-07-23 NOTE — Telephone Encounter (Signed)
Called pt left mess  Need name of pharmacy

## 2010-07-23 NOTE — Telephone Encounter (Signed)
Wants to be seen today. Has sore throat, coughing and green mucus. Please advise patient and return her call.

## 2010-10-02 ENCOUNTER — Other Ambulatory Visit: Payer: Self-pay | Admitting: Family Medicine

## 2010-11-02 NOTE — Discharge Summary (Signed)
   NAME:  Janice Bright, Janice Bright                        ACCOUNT NO.:  [id number]   MEDICAL RECORD NO.:  [id number]                   PATIENT TYPE:  INP   LOCATION:  9304                                 FACILITY:  [facility]   PHYSICIAN:  Juan H. Lily Peer, M.D.             DATE OF BIRTH:  1955/10/13   DATE OF ADMISSION:  03/01/2003  DATE OF DISCHARGE:  03/03/2003                                 DISCHARGE SUMMARY   DISCHARGE DIAGNOSES:  Symptomatic leiomyomata uteri with anemia,  dysmenorrhea, menorrhagia.   PROCEDURE:  Total abdominal hysterectomy with bilateral salpingo-  oophorectomy.   HISTORY OF PRESENT ILLNESS:  A 55 year old gravida 3, para 3 with long-  standing history of menorrhagia, dysmenorrhea, fibroid uterus that has  increased in size over the past few years.  She had endometrial hyperplasia  and had been placed on Megace last year.  The patient had poor compliance.  Finally, due to her severe anemia she returned after being seen by Quita Skye.  Kindl, M.D., her primary physician and found that her hemoglobin was down to  8.  She did not come for follow-up endometrial biopsy so she had an  endometrial biopsy on January 06, 2003 which demonstrated benign secretory  endometrium.  No hyperplasia or malignancy was seen.  She had some history  of mixed stress and urge incontinence and was recommended to lose some  weight and no surgery for this condition.   HOSPITAL COURSE:  The patient was admitted on March 01, 2003 for total  abdominal hysterectomy.  She was found to have a large fibroid.  No  hyperplasia or malignancy was identified.  She did well postoperatively.  She remained afebrile.  Blood pressures initially were elevated 174/100 and  then were 130s/80s.  She had used a blood pressure medication in the past  but she was using none now since she had lost weight.  She was discharged  home in satisfactory condition with vital signs stable on her second  postoperative day.  She was  to follow up in the office several days to have  staples removed.  She was voiding and doing well and requested to be  discharged home on the second postoperative day.   LABORATORIES:  Postoperatively white count 6.9, hemoglobin 9.7, hematocrit  30.4, platelets 363,000.   DISPOSITION:  She was discharged home.  Instructed to follow up in the  office in four days for staple removal and again in four weeks.  She was  discharged home with Climara patch 0.1 mg q.week.  Prescription for Lortab  was given with instructions for pain control.     Janice Bright, N.P.                      Lars Mage H. Lily Peer, M.D.    [facility] Lanius  D:  03/17/2003  T:  03/17/2003  Job:  [id number]

## 2010-11-02 NOTE — Op Note (Signed)
Janice Bright, Janice Bright              ACCOUNT NO.:  [id number]   MEDICAL RECORD NO.:  [id number]          PATIENT TYPE:  OUT   LOCATION:  DFTL                         FACILITY:  [facility]   PHYSICIAN:  Ollen Gross. Vernell Morgans, M.D. DATE OF BIRTH:  Nov 02, 1955   DATE OF PROCEDURE:  05/03/2004  DATE OF DISCHARGE:  05/03/2004                                 OPERATIVE REPORT   PREOPERATIVE DIAGNOSIS:  Right breast ductal papilloma.   POSTOPERATIVE DIAGNOSIS:  Right breast ductal papilloma.   PROCEDURE:  Right breast needle localized lumpectomy.   SURGEON:  Ollen Gross. Carolynne Edouard, M.D.   ANESTHESIA:  General.   PROCEDURE:  After informed consent was obtained, the patient was brought to  the operating room and placed in the supine position on the operating table.  After the adequate induction of general anesthesia, the patient's right  breast was prepped with Betadine and draped in the usual sterile manner.  A  radial incision was made on the inferior portion of the right breast to  include the entry site of the wire.  This incision was carried down through  the skin and subcutaneous tissue sharply with electrocautery.  The course of  the wire could be palpated and an area of breast tissue around the path of  the wire was excised sharply with electrocautery in a circumferential  manner.  Once the tissue had been removed, the tissue was oriented and sent  to pathology for further evaluation.  The wound was irrigated with copious  amounts of saline and hemostasis was achieved using Bovie electrocautery.  The skin incision was closed with a running 4-0 Monocryl subcuticular  stitch.  Benzoin, Steri-Strips, and sterile dressings were applied.  The  patient tolerated the procedure well.  At the end of the case, all needle,  sponge, and instrument counts were correct.  The patient was awakened and  taken to the recovery room in stable condition.      Renae Fickle   PST/MEDQ  D:  05/16/2004  T:  05/16/2004  Job:   [id number]

## 2010-11-02 NOTE — Discharge Summary (Signed)
   NAME:  Bright, Janice                        ACCOUNT NO.:  [id number]   MEDICAL RECORD NO.:  [id number]                   PATIENT TYPE:  INP   LOCATION:  9111                                 [facility]ACILITY[facility]   PHYSICIAN:  Juan H. Lily Bright, M.D.             DATE OF BIRTH:  08-Mar-1956   DATE OF ADMISSION:  02/07/2003  DATE OF DISCHARGE:  02/08/2003                                 DISCHARGE SUMMARY   DISCHARGE DIAGNOSES:  Symptomatic leiomyomata uterus with anemia.   PROCEDURE:  Total abdominal hysterectomy with bilateral salpingo-  oophorectomy.   HISTORY OF PRESENT ILLNESS:  A 55 year old gravida 3, para 3 with a long-  standing history of menorrhagia and dysmenorrhea with a fibroid uterus that  has increased in size over the past few years.  She had endometrial  hyperplasia, been placed on Megace, poor compliance and finally due to the  severe anemia she returned to the office and found that hemoglobin was down  to 8.4.  She underwent an endometrial biopsy July 22 which demonstrated  benign secretory endometrium with no hyperplasia or malignancy identified.  Uterus demonstrated about a 14-week sized uterus on ultrasound with multiple  fibroids.  She also had had signs of urinary frequency and urgency and  pressure from the fibroids.  Prior to her surgery she had 2 units of packed  rbc's.  Post transfusion prior to her surgery her hemoglobin/hematocrit  improved to 10.2 and 33.8.  Prior to the transfusion they were 8.4 and 28.1.   HOSPITAL COURSE:  She was admitted on February 08, 2003 for an abdominal  hysterectomy, BSO for the fibroids, anemia, menorrhagia.  Postoperatively  she did well.  Her temperature was 100.4 and she was discharged on her first  postoperative day with instructions to return to the office in one week.   DISPOSITION:  The patient was discharged to home on postoperative day with  instructions to follow up in the office in one week to have staples removed.  A  prescription for Megace 20 b.i.d. for two weeks.  Continue her iron  supplement daily.  Vicodin p.r.n. q.4-6h. for pain.  The patient was also  given a prescription for penicillin 500 q.i.d. for 10 days.  Her surgery was  canceled secondary to bronchitis so she did not receive the total abdominal  hysterectomy and was scheduled to follow up in the office.  The patient was  instructed to follow up in one week for follow-up with bronchitis.  She did  not have the surgery.     Janice Bright, N.P.                      Janice Bright, M.D.    Janice Bright  D:  02/23/2003  T:  02/23/2003  Job:  [id number]

## 2010-11-02 NOTE — Procedures (Signed)
[place]  Patient:    Janice Bright, Janice Bright                     MRN: [id number] Proc. Date: 12/03/00 Adm. Date:  [id number] Attending:  Armanda Magic CC:         Dyanne Carrel, M.D.   Procedure Report  REFERRING PHYSICIAN:  Dyanne Carrel, M.D.  CHIEF COMPLAINT:  This is a 55 year old female with a history of two syncopal episodes, one of which sounded orthostatic by history and the other vasovagal. She now presents for tilt table testing.  DESCRIPTION OF PROCEDURE:  The patient was brought to the electrophysiology laboratory in the fasting, nonsedated state.  Informed consent was obtained. The patient was continued to continuous heart rate and pulse oximetry monitoring and intermittent blood pressure monitoring.  Baseline blood pressure was measured in the supine position for a total of seven minutes. Baseline blood pressure was 123/71 with a heart rate in the 90s.  The patient was then tilted upright to 70 degrees for a total of 30 minutes.  The lowest blood pressure achieved during that time interval was 118/88 with a heart rate of below 100.  The patient was then placed supine and started on Isuprel.  The highest dose of Isuprel obtained was 0.5 mcg drip.  The patient was then tilted upright again for a total of 15 minutes.  The lowest blood pressure achieved during this time interval was 106/83 with a heart rate of 116.  The patient had no symptoms of dizziness.  She did have some mild chest tightness transiently on re-tilt with Isuprel.  There were no EKG changes noted on EKG.  IMPRESSION: 1. Syncope of unclear etiology.  There are some symptoms that are consistent    with vasovagal episode and other symptoms consistent with orthostatic    hypotension. 2. Negative tilt table test.  PLAN: 1. Continue Zoloft 100 mg a day. 2. Start Toprol XL 25 mg a day to treat prophylactically for vasovagal    syncope.  She has stopped  her Maxzide to avoid volume depletion, which can    potentiate orthostatic hypotension.  She will follow up with me in two    weeks. DD:  12/03/00 TD:  12/03/00 Job: [id number]

## 2010-11-02 NOTE — Op Note (Signed)
NAME:  Janice Bright, Janice Bright                        ACCOUNT NO.:  [id number]   MEDICAL RECORD NO.:  [id number]                   PATIENT TYPE:  INP   LOCATION:  9304                                 [facility]ACILITY[facility]   PHYSICIAN:  Juan H. Lily Peer, M.D.             DATE OF BIRTH:  09/16/1955   DATE OF PROCEDURE:  03/01/2003  DATE OF DISCHARGE:                                 OPERATIVE REPORT   SURGEON:  Juan H. Lily Peer, M.D.   FIRST ASSISTANT:  Ivor Costa. Farrel Gobble, M.D.   INDICATION FOR OPERATION:  A 55 year old, gravida 3, para 3, with  longstanding history of menometrorrhagia, dysmenorrhea, and fibroid uterus.  The patient previously was going to be scheduled for a total abdominal  hysterectomy with bilateral salpingo-oophorectomy approximately two weeks  ago due to persistent cough.  The case was rescheduled to today's date after  the patient received outpatient antibiotics and Solu-Medrol and having been  cleared by Casimiro Needle B. Sherene Sires, M.D., pulmonologist.   PREOPERATIVE DIAGNOSES:  1. Leiomyomatous uteri.  2. Iron deficiency anemia.  3. Menorrhagia.  4. Dysmenorrhea.   POSTOPERATIVE DIAGNOSES:  1. Leiomyomatous uteri.  2. Iron deficiency anemia.  3. Menorrhagia.  4. Dysmenorrhea.   ANESTHESIA:  General endotracheal anesthesia.   PROCEDURES PERFORMED:  Total abdominal hysterectomy with bilateral salpingo-  oophorectomy.   FINDINGS:  A 12-14-week size leiomyomatous uteri with multiple submucous  intramural and subserosal leiomyomas.  Normal-appearing tubes and ovaries.   DESCRIPTION OF PROCEDURE:  After the patient was adequately counseled, she  was taken to the operating room where she underwent successful general  endotracheal anesthesia.  The patient's preoperative hemoglobin and  hematocrit had been 12.1 and 37.9, respectively, with a platelet count of  430,000.  After the general endotracheal anesthesia was on board, the  patient's abdomen was prepped and draped in usual  sterile fashion.  Pneumatic compression stockings were in place to prevent deep venous  thrombosis.  She also had received 2 g of Cefotan prophylactically.  After  the drapes were in place, a Pfannenstiel skin incision was made 2 cm above  the symphysis pubis and the incision was carried down through the skin and  subcutaneous tissue down to the rectus fascia whereby a midline nick was  made.  The fascia was incised in a transverse fashion.  The peritoneal  cavity was entered cautiously.  The patient was placed on Trendelenburg  position.  The O'Connor-O'Sullivan retractors were then in place.  The left  round ligament was identified and was transected with a Bovie.  The anterior  broad ligament was incised to the level of the uterine arteries and up to  the anterior cervical os.  The left infundibulopelvic ligament was  identified away from the ureter.  The infundibulopelvic ligament was doubly  clamped and cut and the proximal portion was free tied with 0 Vicryl suture.  Due to the fact that it was so thin,  a transfixion stitch was not placed,  but a second free tie was placed securing the infundibulopelvic ligament.  After meticulous dissection, the broad ligament and cardinal ligament were  serially clamped, cut, and suture ligated to the level of the uterine  artery.  A similar procedure was carried out on the contralateral side.  Due  to the size of the uterus, the uterus was amputated from the cervix and  passed off the operative field.  Both lateral fornices were clamped, cut,  and suture ligated and secured with 0 Vicryl suture and the remaining  cervical stub was removed.  The remaining vaginal cuff was closed with  interrupted sutures of 0 Vicryl in a figure-of-eight fashion.  Following  this, copious irrigation of the pelvic cavity was performed.  Systemic  inspection of pelvic cavity demonstrated good hemostasis of the  infundibulopelvic ligaments, as well as the vaginal cuff.   The sponge count  and needle count were correct.  The sponges were removed.  The O'Connor  retractor was removed.  The visceral perineum was not reapproximated, but  the rectus fascia was closed with 0 Vicryl suture.  The subcutaneous  bleeders were Bovie cauterized.  The skin was reapproximated with skin clips  followed by 4 x 8 dressing.  Of note, prior to this, 0.25% Marcaine for a  total of 20 mL was infiltrated into the incision site for postoperative  analgesia.  The patient was awaken and transferred to the recovery room with  stable vital signs.  The blood loss for the procedure was 300 mL.  Urine  output was 150 mL.  IV fluids were 24 mL of lactated Ringer's.                                               Juan H. Lily Peer, M.D.    JHF/MEDQ  D:  03/01/2003  T:  03/01/2003  Job:  [id number]

## 2010-11-02 NOTE — H&P (Signed)
NAME:  Janice Bright, Janice Bright                        ACCOUNT NO.:  [id number]   MEDICAL RECORD NO.:  [id number]                   PATIENT TYPE:  INP   LOCATION:  9111                                 [facility]ACILITY[facility]   PHYSICIAN:  Juan H. Lily Peer, M.D.             DATE OF BIRTH:  1955-09-06   DATE OF ADMISSION:  02/07/2003  DATE OF DISCHARGE:                                HISTORY & PHYSICAL   CHIEF COMPLAINT:  1. Symptomatic leiomyomata uteri.  2. Iron deficiency anemia.  3. Menometrorrhagia.   HISTORY:  The patient is a 55 year old, gravida 3, para 3, with a  longstanding history of menometrorrhagia, dysmenorrhea, and fibroid uterus  which has increased in size over the past few years.  She had endometrial  hyperplasia and had been placed on Megace last year.  The patient had poor  compliance.  Finally due to her severe anemia, she returned back to the  office after she had seen Dr. Artis Flock, her primary physician, and found that  her hemoglobin was down to 8.4.  She did not come to a followup endometrial  biopsy after having been treated for __________ last year.  So she underwent  an endometrial biopsy on January 06, 2003, which demonstrated benign secretory  endometrium.  No hyperplasia or malignancy identified.  On examination on  January 27, 2003, her uterus had demonstrated that it was approximately 14  weeks size on ultrasound with multiple fibroids.  The right and left ovaries  were reported to be normal with the exception of a left echo-free, simple-  appearing cyst on the left and the ovary measured 20 x 15 mm.  There was no  fluid in the cul-de-sac.  The right ovary was normal.  The CBC was as  described above with a  hemoglobin of 8.4.  She complained of headaches,  tiredness, and fatigue as a result of her anemia.  She had complained of  urinary frequency and urgency which was probably attributed to the pressure  from her fibroids.  The urinalysis on January 27, 2003, was  negative.  Her  blood pressure was 152/88.  The rest of her exam was essentially  unremarkable.  The patient's most recent Pap smear was in August of 2004,  which was reported to be normal.  The last report from a mammogram was from  1999.  There has been an issue of compliance with this patient.  Back in  1995, she had been evaluated by Dr. Patsi Sears.  She had seen him for what  he thought was a mixed stress and urge incontinence.  He did urodynamic  testing with normal study, normal compliance, and normal sensation of  bladder.  He just recommended for her to lose weight and whether she may  need surgical intervention in the future was to be wait and see depending on  her symptoms.   PAST MEDICAL HISTORY:  She has had three  normal spontaneous vaginal  deliveries.  She is on Zoloft for depression.   FAMILY HISTORY:  Mother with cardiovascular disease.  Father with a history  of hypertension.   MEDICATIONS:  Zoloft, calcium supplementation, and iron supplementation.   ALLERGIES:  She denies any allergies.   PHYSICAL EXAMINATION:  WEIGHT:  210 pounds.  HEIGHT:  5 feet 10 inches.  VITAL SIGNS:  Blood pressure 152/88.  HEENT:  Unremarkable.  NECK:  Supple.  Trachea midline.  No carotid bruits.  No thyromegaly.  LUNGS:  Clear to auscultation without rhonchi or wheezes.  HEART:  Regular rate and rhythm.  No murmurs or gallops.  BREASTS:  Done at her annular exam on January 27, 2003, which was normal.  ABDOMEN:  Soft and nontender.  PELVIC:  Bartholin's, urethral, and Skene's glands within normal limits.  Vagina and cervix with no gross lesions on inspection.  Uterus approximately  13-14 weeks size and irregular.  Difficult to assess adnexa.  RECTAL:  Unremarkable.   ASSESSMENT:  A 55 year old, gravida 3, para 3, with a longstanding history  of menometrorrhagia, dysmenorrhea, fibroid uterus, and past history of  endometrial hyperplasia treated with antiestrogen and followup  endometrial  biopsy a year later with no evidence of hyperplasia.  As a result of her  fibroid uterus, the patient has suffered from iron deficiency anemia.  She  has had a compliance issue.  Her last hemoglobin was 8.4.  The patient is to  be admitted the day before her surgery on February 07, 2003, to be transfused  two units of packed red blood cells for her surgery.  Although 55 years of  age, the patient still has reservations of removing her ovaries at the time  of the hysterectomy.  We talked about the risks, benefits, pros, and cons of  leaving the ovaries behind.  Since she is 55 years of age, it was  recommended that she have her ovaries removed.  She will let me known prior  to commencing of her surgery if she wants to have her ovaries removed.  The  risks of the operation were discussed, including infection, bleeding, and  trauma to internal organs.  She will receive antibiotics to prevent  infection.  She will have pneumatic compression stockings to prevent DVT.  There is also a risk of internal organs, such as the bladder, intestines,  nerves, and blood vessels, which may need corrective surgery at that time.  The patient is fully aware of all of these risks.  She will have pneumatic  compression stockings to prevent deep venous thrombosis and pulmonary  embolism.  Anesthesia was to discuss the complications and risks from  anesthesia.  The patient has been provided with literature information from  the [employer] of OB/GYN on the procedure.  In the event of  uncontrollable hemorrhage if she were to need a transfusion, she is fully  aware of the risks from transfusion to include anaphylactic reaction,  hepatitis, and AIDS.  All of these issues were discussed with the patient.  All questions were answered.  Will following accordingly.   PLAN:  The patient is scheduled for TAH and possible BSO on Tuesday, February 08, 2003, at [facility].  She will be admitted on February 07, 2003, for  transfusion of two units of packed red blood cells.  Juan H. Lily Peer, M.D.    JHF/MEDQ  D:  02/07/2003  T:  02/07/2003  Job:  [id number]

## 2010-12-17 ENCOUNTER — Encounter: Payer: Self-pay | Admitting: Family Medicine

## 2010-12-17 ENCOUNTER — Ambulatory Visit (INDEPENDENT_AMBULATORY_CARE_PROVIDER_SITE_OTHER): Payer: BC Managed Care – PPO | Admitting: Gynecology

## 2010-12-17 ENCOUNTER — Ambulatory Visit (INDEPENDENT_AMBULATORY_CARE_PROVIDER_SITE_OTHER): Payer: BC Managed Care – PPO | Admitting: Family Medicine

## 2010-12-17 VITALS — BP 130/90 | HR 74 | Temp 98.6°F | Ht 63.5 in | Wt 233.0 lb

## 2010-12-17 DIAGNOSIS — B3731 Acute candidiasis of vulva and vagina: Secondary | ICD-10-CM

## 2010-12-17 DIAGNOSIS — B373 Candidiasis of vulva and vagina: Secondary | ICD-10-CM

## 2010-12-17 DIAGNOSIS — M79602 Pain in left arm: Secondary | ICD-10-CM

## 2010-12-17 DIAGNOSIS — N898 Other specified noninflammatory disorders of vagina: Secondary | ICD-10-CM

## 2010-12-17 DIAGNOSIS — M79609 Pain in unspecified limb: Secondary | ICD-10-CM

## 2010-12-17 DIAGNOSIS — M7710 Lateral epicondylitis, unspecified elbow: Secondary | ICD-10-CM

## 2010-12-17 DIAGNOSIS — N390 Urinary tract infection, site not specified: Secondary | ICD-10-CM

## 2010-12-17 DIAGNOSIS — R82998 Other abnormal findings in urine: Secondary | ICD-10-CM

## 2010-12-17 MED ORDER — ETODOLAC 500 MG PO TABS: 500.0000 mg | ORAL_TABLET | Freq: Two times a day (BID) | ORAL | Status: DC

## 2010-12-17 MED ORDER — LOSARTAN POTASSIUM-HCTZ 100-25 MG PO TABS: 1.0000 | ORAL_TABLET | Freq: Every day | ORAL | Status: DC

## 2010-12-18 ENCOUNTER — Encounter: Payer: Self-pay | Admitting: Family Medicine

## 2010-12-18 NOTE — Progress Notes (Signed)
  Subjective:    Patient ID: RIO TABER, female    DOB: 1956/02/05, 55 y.o.   MRN: [id number]  HPI Here for several different problems involving the left shoulder, arm, and hand. About 3 months ago she began to have intermittent pain from the left posterior neck into the left shoulder, and sometimes this would shoot down the left arm as well. No recent trauma. She would then have occasional tingling and numbness into the left hand, and her grip may get weak at times. Then around one month ago she also began to have a more constant pain in the left elbow and forearm. She spends most of her day on the computer at work.    Review of Systems  Musculoskeletal: Positive for arthralgias. Negative for back pain and joint swelling.  Neurological: Positive for weakness and numbness.       Objective:   Physical Exam  Constitutional: She is oriented to person, place, and time. She appears well-developed and well-nourished.  Musculoskeletal:       Tender with some spasm in the left upper trapezius and the left upper thoracic spine area. Full ROM of the neck. Very tender over the lateral epicondyle at the left elbow with full ROM. The hand appears intact  Neurological: She is alert and oriented to person, place, and time. No cranial nerve deficit. She exhibits normal muscle tone. Coordination normal.          Assessment & Plan:  She has a combination of tennis elbow and another problem, either some pinched nerves in the left neck or carpal tunnel syndrome. Use rest, ice packs, and Etodolac to reduce inflammation. Set up a NCS to determine where the nerves are getting pinched on the left side.

## 2011-01-18 ENCOUNTER — Encounter: Payer: BC Managed Care – PPO | Admitting: Gynecology

## 2011-01-29 ENCOUNTER — Ambulatory Visit (INDEPENDENT_AMBULATORY_CARE_PROVIDER_SITE_OTHER): Payer: BC Managed Care – PPO | Admitting: Gynecology

## 2011-01-29 ENCOUNTER — Other Ambulatory Visit (HOSPITAL_COMMUNITY)
Admission: RE | Admit: 2011-01-29 | Discharge: 2011-01-29 | Disposition: A | Discharge status: Home / Self Care | Payer: BC Managed Care – PPO | Source: Ambulatory Visit | Attending: Gynecology | Admitting: Gynecology

## 2011-01-29 ENCOUNTER — Encounter: Payer: Self-pay | Admitting: Gynecology

## 2011-01-29 VITALS — BP 128/88

## 2011-01-29 DIAGNOSIS — Z124 Encounter for screening for malignant neoplasm of cervix: Secondary | ICD-10-CM

## 2011-01-29 DIAGNOSIS — N959 Unspecified menopausal and perimenopausal disorder: Secondary | ICD-10-CM

## 2011-01-29 DIAGNOSIS — Z01419 Encounter for gynecological examination (general) (routine) without abnormal findings: Secondary | ICD-10-CM | POA: Insufficient documentation

## 2011-01-29 MED ORDER — ESTRATEST 1.25-2.5 MG PO TABS: 1.2500 | ORAL_TABLET | Freq: Every day | ORAL | Status: DC

## 2011-01-29 MED ORDER — ESTRATEST 1.25-2.5 MG PO TABS: 6.2500 | ORAL_TABLET | Freq: Every day | ORAL | Status: DC

## 2011-01-29 NOTE — Progress Notes (Signed)
Patient is a 49 suture of #3 para 3 who presented to the office today just for pelvic exam and Pap smear. She stated that her insurance coverage she is not allow to have a feels done except her Pap smear in our office today. Her primary physician will be doing her breast exam and her labs. Review of her record and indicated she's had a history of TAH/BSO. Last year she was started on estrogen replacement therapy as a result of her vasomotor symptoms and had been started on Estratest 0.625 mg one tablet daily and has done well. She does her monthly self breast examination is due for mammogram requisition was provided.  Pelvic exam Bartholin urethra Cherlynn Perches was within normal limits vagina cuff intact no gross lesions on inspection bimanual exam unremarkable rectal exam not done.  Assessment normal gynecological examination postmenopausal patient with prior history of TAH/BSO. Pap smear done today no other labs were done her primary physician will be do not prolapse. She was instructed to continue monthly self breast examination schedule her mammogram which is overdue. Prescription refill for Estratest 0.625 mg was provided. She is instructed to continue her calcium and vitamin D for osteoporosis prevention and to continue her weightbearing exercises 3 or 4 times a week as well. She is otherwise scheduled to return back to the office in 2 years for follow up Pap smear or when necessary.

## 2011-02-15 LAB — POC HEMOCCULT BLD/STL (HOME/3-CARD/SCREEN)

## 2011-03-25 ENCOUNTER — Ambulatory Visit (INDEPENDENT_AMBULATORY_CARE_PROVIDER_SITE_OTHER): Payer: BC Managed Care – PPO | Admitting: Family Medicine

## 2011-03-25 ENCOUNTER — Encounter: Payer: Self-pay | Admitting: Family Medicine

## 2011-03-25 VITALS — BP 128/86 | HR 88 | Temp 98.7°F | Wt 242.0 lb

## 2011-03-25 DIAGNOSIS — R209 Unspecified disturbances of skin sensation: Secondary | ICD-10-CM

## 2011-03-25 DIAGNOSIS — R202 Paresthesia of skin: Secondary | ICD-10-CM

## 2011-03-25 DIAGNOSIS — I10 Essential (primary) hypertension: Secondary | ICD-10-CM

## 2011-03-25 LAB — CBC WITH DIFFERENTIAL/PLATELET
Basophils Absolute: 0 10*3/uL (ref 0.0–0.1)
Eosinophils Absolute: 0.2 10*3/uL (ref 0.0–0.7)
Lymphocytes Relative: 24.2 % (ref 12.0–46.0)
MCHC: 33.5 g/dL (ref 30.0–36.0)
MCV: 87.8 fl (ref 78.0–100.0)
Monocytes Absolute: 0.5 10*3/uL (ref 0.1–1.0)
Neutrophils Relative %: 63.4 % (ref 43.0–77.0)
Platelets: 220 10*3/uL (ref 150.0–400.0)
RBC: 4.63 Mil/uL (ref 3.87–5.11)

## 2011-03-25 LAB — HEPATIC FUNCTION PANEL
AST: 35 U/L (ref 0–37)
Albumin: 4.3 g/dL (ref 3.5–5.2)
Alkaline Phosphatase: 135 U/L — ABNORMAL HIGH (ref 39–117)
Total Protein: 7.8 g/dL (ref 6.0–8.3)

## 2011-03-25 LAB — POCT URINALYSIS DIPSTICK
Bilirubin, UA: NEGATIVE
Blood, UA: NEGATIVE
Glucose, UA: NEGATIVE
Leukocytes, UA: NEGATIVE
Nitrite, UA: NEGATIVE
Urobilinogen, UA: 0.2
pH, UA: 5.5

## 2011-03-25 LAB — BASIC METABOLIC PANEL
BUN: 21 mg/dL (ref 6–23)
CO2: 28 mEq/L (ref 19–32)
Calcium: 8.9 mg/dL (ref 8.4–10.5)
GFR: 73.63 mL/min (ref >60.00)
Glucose, Bld: 97 mg/dL (ref 70–99)

## 2011-03-25 LAB — VITAMIN B12: Vitamin B-12: 428 pg/mL (ref 211–911)

## 2011-03-25 MED ORDER — AMLODIPINE BESYLATE 5 MG PO TABS: 5.0000 mg | ORAL_TABLET | Freq: Every day | ORAL | Status: DC

## 2011-03-25 NOTE — Progress Notes (Signed)
  Subjective:    Patient ID: Janice Bright, female    DOB: Sep 01, 1955, 55 y.o.   MRN: [id number]  HPI Here for the onset this morning at work of tingling all over her body, especially in the hands and the face. She was on vacation last week, and she admits to eating all the wrong things. She also has been talking in a lot more salt than usual, including this morning when she ate grits covered with a lot of salt. At work this am her BP was 168/96. No SOB or chest pain. Now she feels a lot better than earlier today.    Review of Systems  Constitutional: Negative.   HENT: Negative.   Respiratory: Negative.   Cardiovascular: Negative.   Neurological: Positive for numbness. Negative for dizziness, tremors, seizures, syncope, facial asymmetry, speech difficulty, weakness, light-headedness and headaches.       Objective:   Physical Exam  Constitutional: She is oriented to person, place, and time. She appears well-developed and well-nourished.  Neck: Neck supple. No thyromegaly present.  Cardiovascular: Normal rate, regular rhythm, normal heart sounds and intact distal pulses.   Pulmonary/Chest: Effort normal and breath sounds normal.  Lymphadenopathy:    She has no cervical adenopathy.  Neurological: She is alert and oriented to person, place, and time. She has normal reflexes. No cranial nerve deficit. She exhibits normal muscle tone. Coordination normal.          Assessment & Plan:  I think her symptoms are from elevated BP, and her recent excessive salt consumption has played a role. Add Norvasc daily, and get labs today. Recheck on month

## 2011-03-28 ENCOUNTER — Telehealth: Payer: Self-pay | Admitting: Family Medicine

## 2011-03-28 NOTE — Telephone Encounter (Signed)
Message copied by Baldemar Friday on Thu Mar 28, 2011  3:57 PM ------      Message from: Gershon Crane A      Created: Tue Mar 26, 2011  6:08 AM       normal

## 2011-03-28 NOTE — Telephone Encounter (Signed)
Spoke with pt and gave results. 

## 2011-03-29 ENCOUNTER — Encounter: Payer: Self-pay | Admitting: Family Medicine

## 2011-07-19 ENCOUNTER — Encounter: Payer: Self-pay | Admitting: Family Medicine

## 2011-07-19 ENCOUNTER — Ambulatory Visit (INDEPENDENT_AMBULATORY_CARE_PROVIDER_SITE_OTHER): Payer: BC Managed Care – PPO | Admitting: Family Medicine

## 2011-07-19 VITALS — BP 118/76 | HR 113 | Temp 98.4°F | Wt 252.0 lb

## 2011-07-19 DIAGNOSIS — J329 Chronic sinusitis, unspecified: Secondary | ICD-10-CM

## 2011-07-19 MED ORDER — AMOXICILLIN-POT CLAVULANATE 875-125 MG PO TABS: 1.0000 | ORAL_TABLET | Freq: Two times a day (BID) | ORAL | Status: AC

## 2011-07-19 NOTE — Progress Notes (Signed)
  Subjective:    Patient ID: Janice Bright, female    DOB: June 07, 1956, 56 y.o.   MRN: [id number]  HPI Here for recurrent sinus problems that have been bothering her for 5 months. This involves pressure, PND, HAs, ST, and a dry cough. No fever. Using Mucinex and nasal sprays.    Review of Systems  Constitutional: Negative.   HENT: Positive for congestion, postnasal drip and sinus pressure.   Eyes: Negative.   Respiratory: Positive for cough.        Objective:   Physical Exam  Constitutional: She appears well-developed and well-nourished.  HENT:  Right Ear: External ear normal.  Left Ear: External ear normal.  Nose: Nose normal.  Mouth/Throat: Oropharynx is clear and moist. No oropharyngeal exudate.  Eyes: Conjunctivae are normal.  Pulmonary/Chest: Effort normal and breath sounds normal.  Lymphadenopathy:    She has no cervical adenopathy.          Assessment & Plan:  Treat for chronic infection. Recheck prn

## 2011-09-17 ENCOUNTER — Encounter: Payer: Self-pay | Admitting: Internal Medicine

## 2011-09-17 ENCOUNTER — Ambulatory Visit (INDEPENDENT_AMBULATORY_CARE_PROVIDER_SITE_OTHER): Payer: BC Managed Care – PPO | Admitting: Internal Medicine

## 2011-09-17 VITALS — BP 122/90 | HR 105 | Temp 98.8°F | Wt 257.0 lb

## 2011-09-17 DIAGNOSIS — I10 Essential (primary) hypertension: Secondary | ICD-10-CM

## 2011-09-17 DIAGNOSIS — R209 Unspecified disturbances of skin sensation: Secondary | ICD-10-CM

## 2011-09-17 DIAGNOSIS — R609 Edema, unspecified: Secondary | ICD-10-CM

## 2011-09-17 LAB — POCT URINALYSIS DIPSTICK
Blood, UA: NEGATIVE
Protein, UA: NEGATIVE
Spec Grav, UA: 1.025
Urobilinogen, UA: 0.2

## 2011-09-17 NOTE — Progress Notes (Signed)
Subjective:    Patient ID: Janice Bright, female    DOB: 1956-05-14, 56 y.o.   MRN: [id number]  HPI Patient comes in today for SDA for  new problem evaluation. Usually sees Dr. Clent Ridges who is out of the office today. She is on about a one-week history of swelling of her hands and feet and a sensation of needles and pins light Pricking  All over the place . Includes all extremities and even her face. There are no new medicines except for ibuprofen for the last 2 weeks that she was given by her employer's physician for some ear pain which has helped. No new medicines otherwise. Her blood pressure has been okay.  She is on an antihistamine for allergies. She has no hives and no coughing but her husband was worried about her breathing. She sometimes takes a deep breath in size unsure if she is high at any nocturnal symptoms. There was a time where her tongue felt swollen but not now  Some throat problem in the past.   New med  Ibuprofen for  Face and soreness .  Took 2 weeks.  Or so.   No nausea vomiting diarrhea rashes vision changes. Denies change in diet excess salt.  Review of Systems Neg cp cough bleeding  On many meds  No change except as above   No fever uti sx.    Sees doctors at [facility] at times but wanted to come to See Dr Clent Ridges today for this problem.  Outpatient Encounter Prescriptions as of 09/17/2011  Medication Sig Dispense Refill  . amLODipine (NORVASC) 5 MG tablet Take 1 tablet (5 mg total) by mouth daily.  30 tablet  11  . azelastine (ASTELIN) 137 MCG/SPRAY nasal spray Place 1 spray into the nose 2 (two) times daily. Use in each nostril as directed       . Calcium Carbonate-Vitamin D (CALCIUM 600+D) 600-400 MG-UNIT per tablet Take 1 tablet by mouth daily.       . cycloSPORINE (RESTASIS) 0.05 % ophthalmic emulsion Place 1 drop into both eyes 2 (two) times daily.        . Est Estrogens-Methyltest (ESTRATEST) 1.25-2.5 MG TABS Take 6.25 capsules by mouth daily.  90 each  4  .  fish oil-omega-3 fatty acids 1000 MG capsule Take 2 g by mouth daily.        Marland Kitchen losartan-hydrochlorothiazide (HYZAAR) 100-25 MG per tablet Take 1 tablet by mouth daily.  30 tablet  11  . Multiple Vitamin (MULTIVITAMIN) tablet Take 1 tablet by mouth daily.        . fexofenadine (ALLEGRA) 180 MG tablet Take 180 mg by mouth daily.        Marland Kitchen LEXAPRO 20 MG tablet TAKE 1 TABLET EVERY DAY  30 tablet  1  Ibuprofen  Per company doctor.  Objective:   Physical Exam wdwn in nad HEENT: Normocephalic ;atraumatic , Eyes;  PERRL, EOMs  Full, lids and conjunctiva clear,,Ears: no deformities, canals nl, TM landmarks normal, Nose: no deformity or discharge  Mouth : OP clear without lesion or edema . Neck: Supple without adenopathy or masses or bruits Chest:  Clear to A&P without wheezes rales or rhonchi CV:  S1-S2 no gallops or murmurs peripheral perfusion is normal extremities  Mild edema 1-2 plus puffiness hands and feet no rashes   No hives or angioedema.      Assessment & Plan:   Swelling  With nl cv pulm exam   Skin sensation associated sounds systemic   ?  If med reaction . Stop the ibuprofen . She is on  A number of  meds that can cause swelling but none are new but the ibuprofen.  ( see instructions)   Labs today  And plan fu with dr Clent Ridges.    HT stable HRT Allergy hx

## 2011-09-17 NOTE — Patient Instructions (Signed)
Unsure why your have swelling but ibuprofen can interfere with BP medications and kidneys and cause fluid retention.  In case the prickly feeling is some type of allergic reaction  Take benadryl  25 mg every 6-8 hours to see if helps the sensation. It can  Cause drowsiness but  Try it just for a few days.  Will notify you  of labs when available.  Plan follow up visit.  With Dr.  Clent Ridges .

## 2011-09-18 LAB — BASIC METABOLIC PANEL
BUN: 21 mg/dL (ref 6–23)
CO2: 27 mEq/L (ref 19–32)
Chloride: 102 mEq/L (ref 96–112)
Creatinine, Ser: 1.2 mg/dL (ref 0.4–1.2)
Glucose, Bld: 102 mg/dL — ABNORMAL HIGH (ref 70–99)
Potassium: 3.8 mEq/L (ref 3.5–5.1)

## 2011-09-18 LAB — CBC WITH DIFFERENTIAL/PLATELET
Eosinophils Absolute: 0.2 10*3/uL (ref 0.0–0.7)
Eosinophils Relative: 3.2 % (ref 0.0–5.0)
HCT: 40 % (ref 36.0–46.0)
Lymphs Abs: 1.3 10*3/uL (ref 0.7–4.0)
MCHC: 32.9 g/dL (ref 30.0–36.0)
MCV: 88.4 fl (ref 78.0–100.0)
Monocytes Absolute: 0.5 10*3/uL (ref 0.1–1.0)
Platelets: 217 10*3/uL (ref 150.0–400.0)
WBC: 6.4 10*3/uL (ref 4.5–10.5)

## 2011-09-18 LAB — SEDIMENTATION RATE: Sed Rate: 21 mm/hr (ref 0–22)

## 2011-09-18 LAB — HEPATIC FUNCTION PANEL
AST: 34 U/L (ref 0–37)
Albumin: 4.2 g/dL (ref 3.5–5.2)
Alkaline Phosphatase: 137 U/L — ABNORMAL HIGH (ref 39–117)
Total Protein: 7.6 g/dL (ref 6.0–8.3)

## 2011-09-20 NOTE — Progress Notes (Signed)
Quick Note:  Left a message for pt to return call. ______ 

## 2011-09-23 ENCOUNTER — Other Ambulatory Visit: Payer: Self-pay | Admitting: Family Medicine

## 2011-10-16 ENCOUNTER — Ambulatory Visit (INDEPENDENT_AMBULATORY_CARE_PROVIDER_SITE_OTHER): Payer: BC Managed Care – PPO | Admitting: Family Medicine

## 2011-10-16 ENCOUNTER — Encounter: Payer: Self-pay | Admitting: Family Medicine

## 2011-10-16 VITALS — BP 122/86 | HR 97 | Temp 98.6°F | Wt 260.0 lb

## 2011-10-16 DIAGNOSIS — I10 Essential (primary) hypertension: Secondary | ICD-10-CM

## 2011-10-16 DIAGNOSIS — R600 Localized edema: Secondary | ICD-10-CM

## 2011-10-16 DIAGNOSIS — R609 Edema, unspecified: Secondary | ICD-10-CM

## 2011-10-16 DIAGNOSIS — G629 Polyneuropathy, unspecified: Secondary | ICD-10-CM

## 2011-10-16 DIAGNOSIS — G589 Mononeuropathy, unspecified: Secondary | ICD-10-CM

## 2011-10-16 LAB — VITAMIN B12: Vitamin B-12: 455 pg/mL (ref 211–911)

## 2011-10-16 MED ORDER — ESCITALOPRAM OXALATE 20 MG PO TABS: 20.0000 mg | ORAL_TABLET | Freq: Every day | ORAL | Status: DC

## 2011-10-16 NOTE — Progress Notes (Signed)
  Subjective:    Patient ID: Janice Bright, female    DOB: [birth year], 56 y.o.   MRN: [id number]  HPI Here for several months of swelling in the hands and feet, as well as some generalized tingling over the body. No SOB or cough. She saw Dr. Fabian Sharp last month and had some labs that were unremarkable. They tried stopping the Ibuprofen she had been taking in case this was the cause, but nothing has changed.    Review of Systems  Constitutional: Negative.   Respiratory: Negative.   Cardiovascular: Positive for leg swelling. Negative for chest pain and palpitations.  Neurological: Negative.        Objective:   Physical Exam  Constitutional: She is oriented to person, place, and time. She appears well-developed and well-nourished.  Eyes: Conjunctivae are normal. Pupils are equal, round, and reactive to light.  Neck: No thyromegaly present.  Cardiovascular: Normal rate, regular rhythm, normal heart sounds and intact distal pulses.   Pulmonary/Chest: Effort normal and breath sounds normal.  Musculoskeletal:       1+ edema to the hands and feet   Lymphadenopathy:    She has no cervical adenopathy.  Neurological: She is alert and oriented to person, place, and time. She has normal reflexes. No cranial nerve deficit. She exhibits normal muscle tone. Coordination normal.          Assessment & Plan:  This edema is likely due to side effects of her Amlodipine. She will stop this and see if the edema subsides over the next few weeks. Get an A1c and a B12 level today. Recheck in one month. We need to watch her BP closely.

## 2011-10-21 ENCOUNTER — Encounter: Payer: Self-pay | Admitting: Family Medicine

## 2011-10-21 NOTE — Progress Notes (Signed)
Quick Note:  I tried to reach pt by phone, no answer. I put a copy of results in mail. ______ 

## 2012-08-07 ENCOUNTER — Encounter: Payer: Self-pay | Admitting: Family Medicine

## 2012-08-07 ENCOUNTER — Ambulatory Visit (INDEPENDENT_AMBULATORY_CARE_PROVIDER_SITE_OTHER): Payer: BC Managed Care – PPO | Admitting: Family Medicine

## 2012-08-07 VITALS — BP 144/90 | HR 91 | Temp 98.4°F | Wt 254.0 lb

## 2012-08-07 DIAGNOSIS — G589 Mononeuropathy, unspecified: Secondary | ICD-10-CM

## 2012-08-07 DIAGNOSIS — G629 Polyneuropathy, unspecified: Secondary | ICD-10-CM

## 2012-08-07 DIAGNOSIS — I10 Essential (primary) hypertension: Secondary | ICD-10-CM

## 2012-08-07 DIAGNOSIS — F411 Generalized anxiety disorder: Secondary | ICD-10-CM

## 2012-08-07 LAB — CBC WITH DIFFERENTIAL/PLATELET
Basophils Absolute: 0 10*3/uL (ref 0.0–0.1)
Basophils Relative: 0.3 % (ref 0.0–3.0)
Eosinophils Absolute: 0.2 10*3/uL (ref 0.0–0.7)
HCT: 40.6 % (ref 36.0–46.0)
Hemoglobin: 13.5 g/dL (ref 12.0–15.0)
Lymphs Abs: 1.3 10*3/uL (ref 0.7–4.0)
MCHC: 33.3 g/dL (ref 30.0–36.0)
MCV: 84.5 fl (ref 78.0–100.0)
Neutro Abs: 3.3 10*3/uL (ref 1.4–7.7)
RDW: 13.6 % (ref 11.5–14.6)

## 2012-08-07 LAB — BASIC METABOLIC PANEL
BUN: 23 mg/dL (ref 6–23)
Chloride: 107 mEq/L (ref 96–112)
Potassium: 4.2 mEq/L (ref 3.5–5.1)

## 2012-08-07 LAB — POCT URINALYSIS DIPSTICK
Blood, UA: NEGATIVE
Ketones, UA: NEGATIVE
Protein, UA: NEGATIVE
Spec Grav, UA: 1.025
pH, UA: 5.5

## 2012-08-07 LAB — HEPATIC FUNCTION PANEL
ALT: 35 U/L (ref 0–35)
AST: 32 U/L (ref 0–37)
Alkaline Phosphatase: 128 U/L — ABNORMAL HIGH (ref 39–117)
Total Bilirubin: 0.5 mg/dL (ref 0.3–1.2)

## 2012-08-07 LAB — LIPID PANEL
Cholesterol: 191 mg/dL (ref 0–200)
LDL Cholesterol: 114 mg/dL — ABNORMAL HIGH (ref 0–99)

## 2012-08-07 MED ORDER — ESCITALOPRAM OXALATE 10 MG PO TABS: 10.0000 mg | ORAL_TABLET | Freq: Every day | ORAL | Status: DC

## 2012-08-07 MED ORDER — LOSARTAN POTASSIUM-HCTZ 100-25 MG PO TABS: 1.0000 | ORAL_TABLET | Freq: Every day | ORAL | Status: DC

## 2012-08-07 NOTE — Progress Notes (Signed)
  Subjective:    Patient ID: Janice Bright, female    DOB: May 23, 1956, 57 y.o.   MRN: [id number]  HPI Here to follow up HTN and anxiety. Her BP at home has been up lately, and she gets readings as high as 170/100. She feels okay except her anxiety has been acting up. She stopped taking Lexapro about 6 months ago because it made her feel "like a zombie". She has been seeing a doctor at [place] where she works. They have changed all the medications around that we had her on, and I am sure this is part of why her BP is up. Also she had put on a lot of weight, but she has recently started seeing a wellness coach from her work and she has lost 6 lbs. She is walking for exercise. She also mentions having intermittent tingling sensations all over her body.    Review of Systems  Constitutional: Negative.   Respiratory: Negative.   Cardiovascular: Negative.        Objective:   Physical Exam  Constitutional: She appears well-developed and well-nourished.  Neck: No thyromegaly present.  Cardiovascular: Normal rate, regular rhythm, normal heart sounds and intact distal pulses.   Pulmonary/Chest: Effort normal and breath sounds normal.  Lymphadenopathy:    She has no cervical adenopathy.  Psychiatric: She has a normal mood and affect. Her behavior is normal. Thought content normal.          Assessment & Plan:  First of all I explained to her that she can have only one PCP at a time, so she agreed to not let the doctor at work get involved with her general care. I stopped the HCTZ and the clonidine patch she had been taking, and instead she will start on Hyzaar daily. We will reduce the dose of Lexapro to 10 mg a day. Get labs today. Recheck here in 3 weeks.

## 2012-08-11 NOTE — Progress Notes (Signed)
Quick Note:  Pt has CPE 08/28/12 will go over then. ______

## 2012-08-28 ENCOUNTER — Ambulatory Visit: Payer: BC Managed Care – PPO | Admitting: Family Medicine

## 2012-08-28 DIAGNOSIS — Z0289 Encounter for other administrative examinations: Secondary | ICD-10-CM

## 2012-10-22 ENCOUNTER — Telehealth: Payer: Self-pay | Admitting: Family Medicine

## 2012-10-22 NOTE — Telephone Encounter (Signed)
Refill request change for Escitalopram 10 mg, wants to get a 90 day supply.

## 2012-10-22 NOTE — Telephone Encounter (Signed)
Call in #[phone] with 3 rf  

## 2012-10-23 MED ORDER — ESCITALOPRAM OXALATE 10 MG PO TABS: 10.0000 mg | ORAL_TABLET | Freq: Every day | ORAL | Status: DC

## 2012-12-07 ENCOUNTER — Telehealth: Payer: Self-pay | Admitting: Family Medicine

## 2012-12-07 NOTE — Telephone Encounter (Signed)
Returned call concerning "tingling in her face and body" and left voice mail.

## 2012-12-07 NOTE — Telephone Encounter (Signed)
I left voice message, just stating that I was returning a call, pt can call us back if she has not been seen yet by a doctor.

## 2013-03-26 ENCOUNTER — Other Ambulatory Visit: Payer: Self-pay | Admitting: Family Medicine

## 2013-04-12 ENCOUNTER — Encounter: Payer: Self-pay | Admitting: Gynecology

## 2013-04-16 ENCOUNTER — Encounter: Payer: Self-pay | Admitting: Gynecology

## 2013-04-19 ENCOUNTER — Other Ambulatory Visit: Payer: Self-pay | Admitting: *Deleted

## 2013-04-19 DIAGNOSIS — R928 Other abnormal and inconclusive findings on diagnostic imaging of breast: Secondary | ICD-10-CM

## 2013-04-23 ENCOUNTER — Other Ambulatory Visit: Payer: Self-pay | Admitting: Emergency Medicine

## 2013-04-23 ENCOUNTER — Ambulatory Visit
Admission: RE | Admit: 2013-04-23 | Discharge: 2013-04-23 | Disposition: A | Discharge status: Home / Self Care | Payer: BC Managed Care – PPO | Source: Ambulatory Visit | Attending: Emergency Medicine | Admitting: Emergency Medicine

## 2013-04-23 DIAGNOSIS — R059 Cough, unspecified: Secondary | ICD-10-CM

## 2013-04-23 DIAGNOSIS — R519 Headache, unspecified: Secondary | ICD-10-CM

## 2013-04-23 DIAGNOSIS — R05 Cough: Secondary | ICD-10-CM

## 2013-04-28 ENCOUNTER — Other Ambulatory Visit: Payer: Self-pay | Admitting: Otolaryngology

## 2013-04-28 DIAGNOSIS — M542 Cervicalgia: Secondary | ICD-10-CM

## 2013-05-07 ENCOUNTER — Ambulatory Visit
Admission: RE | Admit: 2013-05-07 | Discharge: 2013-05-07 | Disposition: A | Discharge status: Home / Self Care | Payer: BC Managed Care – PPO | Source: Ambulatory Visit | Attending: Otolaryngology | Admitting: Otolaryngology

## 2013-05-07 DIAGNOSIS — M542 Cervicalgia: Secondary | ICD-10-CM

## 2013-05-07 MED ORDER — IOHEXOL 300 MG/ML  SOLN
75.0000 mL | Freq: Once | INTRAMUSCULAR | Status: AC | PRN
  Administered 2013-05-07: 75 mL via INTRAVENOUS

## 2013-05-18 ENCOUNTER — Encounter: Payer: Self-pay | Admitting: Family Medicine

## 2013-05-18 ENCOUNTER — Ambulatory Visit (INDEPENDENT_AMBULATORY_CARE_PROVIDER_SITE_OTHER): Payer: BC Managed Care – PPO | Admitting: Family Medicine

## 2013-05-18 VITALS — BP 138/86 | HR 114 | Temp 98.2°F | Wt 260.0 lb

## 2013-05-18 DIAGNOSIS — K625 Hemorrhage of anus and rectum: Secondary | ICD-10-CM

## 2013-05-18 DIAGNOSIS — R1032 Left lower quadrant pain: Secondary | ICD-10-CM

## 2013-05-18 LAB — BASIC METABOLIC PANEL
BUN: 26 mg/dL — ABNORMAL HIGH (ref 6–23)
Calcium: 9.7 mg/dL (ref 8.4–10.5)
Creatinine, Ser: 1.2 mg/dL (ref 0.4–1.2)
GFR: 61.57 mL/min (ref >60.00)
Glucose, Bld: 126 mg/dL — ABNORMAL HIGH (ref 70–99)
Potassium: 3.5 mEq/L (ref 3.5–5.1)
Sodium: 143 mEq/L (ref 135–145)

## 2013-05-18 LAB — CBC WITH DIFFERENTIAL/PLATELET
Basophils Relative: 0.3 % (ref 0.0–3.0)
Eosinophils Absolute: 0.2 10*3/uL (ref 0.0–0.7)
Eosinophils Relative: 3.2 % (ref 0.0–5.0)
HCT: 40.3 % (ref 36.0–46.0)
Hemoglobin: 13.5 g/dL (ref 12.0–15.0)
Lymphocytes Relative: 22.1 % (ref 12.0–46.0)
Lymphs Abs: 1.6 10*3/uL (ref 0.7–4.0)
MCHC: 33.4 g/dL (ref 30.0–36.0)
MCV: 85.7 fl (ref 78.0–100.0)
Monocytes Absolute: 0.6 10*3/uL (ref 0.1–1.0)
Monocytes Relative: 8.4 % (ref 3.0–12.0)
Neutro Abs: 4.7 10*3/uL (ref 1.4–7.7)
Platelets: 280 10*3/uL (ref 150.0–400.0)
RBC: 4.71 Mil/uL (ref 3.87–5.11)
WBC: 7.1 10*3/uL (ref 4.5–10.5)

## 2013-05-18 LAB — HEPATIC FUNCTION PANEL
ALT: 28 U/L (ref 0–35)
AST: 31 U/L (ref 0–37)
Albumin: 4 g/dL (ref 3.5–5.2)
Bilirubin, Direct: 0 mg/dL (ref 0.0–0.3)
Total Bilirubin: 0.3 mg/dL (ref 0.3–1.2)

## 2013-05-18 LAB — AMYLASE: Amylase: 47 U/L (ref 27–131)

## 2013-05-18 MED ORDER — CIPROFLOXACIN HCL 500 MG PO TABS: 500.0000 mg | ORAL_TABLET | Freq: Two times a day (BID) | ORAL | Status: DC

## 2013-05-18 NOTE — Progress Notes (Signed)
Pre visit review using our clinic review tool, if applicable. No additional management support is needed unless otherwise documented below in the visit note. 

## 2013-05-18 NOTE — Progress Notes (Signed)
   Subjective:    Patient ID: Janice Bright, female    DOB: [birth year], 57 y.o.   MRN: [id number]  HPI Here for a bout of blood from the rectum several days ago. She has had some lower abdominal cramps for the past week, and then 3 days ago she passed a large amount of blood into the toilet with no stool present. Since then she has not seen any blood at all, and the cramping has almost stopped. No fever or nausea. She had a colonoscopy in 2011 showing diverticulosis. She typically has loose stools, averaging 4-6 a day.    Review of Systems  Constitutional: Negative.   Gastrointestinal: Positive for abdominal pain and diarrhea. Negative for nausea, vomiting, constipation, blood in stool, abdominal distention and rectal pain.       Objective:   Physical Exam  Constitutional: She appears well-developed and well-nourished. No distress.  Abdominal: Soft. Bowel sounds are normal. She exhibits no distension and no mass. There is no rebound and no guarding.  Slightly tender in the LLQ           Assessment & Plan:  This was probably a diverticular bleed. Cover with Cipro. Get labs and a CT scan.

## 2013-05-25 ENCOUNTER — Ambulatory Visit (INDEPENDENT_AMBULATORY_CARE_PROVIDER_SITE_OTHER)
Admission: RE | Admit: 2013-05-25 | Discharge: 2013-05-25 | Disposition: A | Discharge status: Home / Self Care | Payer: BC Managed Care – PPO | Source: Ambulatory Visit | Attending: Family Medicine | Admitting: Family Medicine

## 2013-05-25 DIAGNOSIS — R1032 Left lower quadrant pain: Secondary | ICD-10-CM

## 2013-05-25 MED ORDER — IOHEXOL 300 MG/ML  SOLN
100.0000 mL | Freq: Once | INTRAMUSCULAR | Status: AC | PRN
  Administered 2013-05-25: 100 mL via INTRAVENOUS

## 2013-09-21 ENCOUNTER — Other Ambulatory Visit: Payer: Self-pay | Admitting: Family Medicine

## 2013-10-25 ENCOUNTER — Encounter: Payer: Self-pay | Admitting: Family Medicine

## 2013-10-25 ENCOUNTER — Ambulatory Visit (INDEPENDENT_AMBULATORY_CARE_PROVIDER_SITE_OTHER): Payer: BC Managed Care – PPO | Admitting: Family Medicine

## 2013-10-25 VITALS — BP 101/75 | HR 95 | Temp 99.0°F | Ht 63.5 in | Wt 250.0 lb

## 2013-10-25 DIAGNOSIS — K5732 Diverticulitis of large intestine without perforation or abscess without bleeding: Secondary | ICD-10-CM

## 2013-10-25 MED ORDER — METRONIDAZOLE 500 MG PO TABS: 500.0000 mg | ORAL_TABLET | Freq: Two times a day (BID) | ORAL | Status: DC

## 2013-10-25 MED ORDER — CIPROFLOXACIN HCL 500 MG PO TABS: 500.0000 mg | ORAL_TABLET | Freq: Two times a day (BID) | ORAL | Status: DC

## 2013-10-25 NOTE — Progress Notes (Signed)
Pre visit review using our clinic review tool, if applicable. No additional management support is needed unless otherwise documented below in the visit note. 

## 2013-10-26 ENCOUNTER — Encounter: Payer: Self-pay | Admitting: Family Medicine

## 2013-10-26 DIAGNOSIS — K5732 Diverticulitis of large intestine without perforation or abscess without bleeding: Secondary | ICD-10-CM | POA: Insufficient documentation

## 2013-10-26 NOTE — Progress Notes (Signed)
   Subjective:    Patient ID: Janice Ridgelheresa D Bright, female    DOB: 01/11/1956, 58 y.o.   MRN: [id number]  HPI Here for 3 weeks of GI problems that started off with a few days of nausea, vomiting, and diarrhea. She saw [facility]. Melrose [facility]Kindle at the [facility] on 10-11-13 and was diagnosed with a GI virus. This seemed to resolve and then she developed more diarrhea with LLQ pains that are similar to the pains she saw us with last December. She had a diverticular bleed at that time which quickly resolved. A CBC then was normal and a CT scan revealed no active infection but the presence of severe diverticulosis in the sigmoid colon. This week she has had no nausea or fever. Drinking plenty of fluids.    Review of Systems  Constitutional: Negative.   Gastrointestinal: Positive for abdominal pain and diarrhea. Negative for nausea, vomiting, constipation, blood in stool, abdominal distention, anal bleeding and rectal pain.  Genitourinary: Negative.        Objective:   Physical Exam  Constitutional: She appears well-developed and well-nourished. No distress.  Pulmonary/Chest: Effort normal and breath sounds normal.  Abdominal: Soft. Bowel sounds are normal. She exhibits no distension and no mass. There is no rebound and no guarding.  Mildly tender in the LLQ          Assessment & Plan:  Treat with Cipro and Flagyl. Eat a light and bland diet this week. Recheck prn

## 2013-12-21 ENCOUNTER — Other Ambulatory Visit: Payer: Self-pay | Admitting: Family Medicine

## 2014-05-08 ENCOUNTER — Other Ambulatory Visit: Payer: Self-pay | Admitting: Family Medicine

## 2014-05-19 ENCOUNTER — Telehealth: Payer: Self-pay

## 2014-05-19 ENCOUNTER — Other Ambulatory Visit: Payer: Self-pay | Admitting: Family Medicine

## 2014-05-19 NOTE — Telephone Encounter (Signed)
Refill - Escitalopram 20 mg Tablet- Take 1 tablet by mouth every day #90   [facility] [address]

## 2014-05-20 MED ORDER — ESCITALOPRAM OXALATE 20 MG PO TABS: 10.0000 mg | ORAL_TABLET | Freq: Every day | ORAL | Status: DC

## 2014-05-20 NOTE — Telephone Encounter (Signed)
Rx sent to pharmacy   

## 2014-05-20 NOTE — Telephone Encounter (Signed)
Refill for one year 

## 2014-09-05 ENCOUNTER — Other Ambulatory Visit: Payer: Self-pay | Admitting: Family Medicine

## 2014-11-04 ENCOUNTER — Encounter: Payer: Self-pay | Admitting: Gynecology

## 2014-11-09 ENCOUNTER — Encounter: Payer: Self-pay | Admitting: Family Medicine

## 2014-11-09 ENCOUNTER — Ambulatory Visit (INDEPENDENT_AMBULATORY_CARE_PROVIDER_SITE_OTHER): Payer: BLUE CROSS/BLUE SHIELD | Admitting: Family Medicine

## 2014-11-09 VITALS — BP 139/83 | HR 91 | Temp 98.8°F | Ht 63.5 in | Wt 257.0 lb

## 2014-11-09 DIAGNOSIS — R350 Frequency of micturition: Secondary | ICD-10-CM

## 2014-11-09 DIAGNOSIS — N390 Urinary tract infection, site not specified: Secondary | ICD-10-CM | POA: Diagnosis not present

## 2014-11-09 DIAGNOSIS — G629 Polyneuropathy, unspecified: Secondary | ICD-10-CM | POA: Diagnosis not present

## 2014-11-09 LAB — POCT URINALYSIS DIPSTICK
Bilirubin, UA: NEGATIVE
Blood, UA: NEGATIVE
GLUCOSE UA: NEGATIVE
KETONES UA: NEGATIVE
Leukocytes, UA: NEGATIVE
Nitrite, UA: NEGATIVE
PH UA: 6.5
Protein, UA: NEGATIVE
Spec Grav, UA: 1.015
Urobilinogen, UA: 1

## 2014-11-09 MED ORDER — NITROFURANTOIN MONOHYD MACRO 100 MG PO CAPS: 100.0000 mg | ORAL_CAPSULE | Freq: Two times a day (BID) | ORAL | Status: DC

## 2014-11-09 NOTE — Progress Notes (Signed)
Pre visit review using our clinic review tool, if applicable. No additional management support is needed unless otherwise documented below in the visit note. 

## 2014-11-10 ENCOUNTER — Encounter: Payer: Self-pay | Admitting: Family Medicine

## 2014-11-10 LAB — BASIC METABOLIC PANEL
BUN: 21 mg/dL (ref 6–23)
CHLORIDE: 104 meq/L (ref 96–112)
CO2: 29 mEq/L (ref 19–32)
CREATININE: 1.31 mg/dL — AB (ref 0.40–1.20)
Calcium: 9.8 mg/dL (ref 8.4–10.5)
GFR: 53.24 mL/min — ABNORMAL LOW (ref >60.00)
GLUCOSE: 94 mg/dL (ref 70–99)
POTASSIUM: 4.9 meq/L (ref 3.5–5.1)
SODIUM: 141 meq/L (ref 135–145)

## 2014-11-10 LAB — HEPATIC FUNCTION PANEL
ALBUMIN: 4.3 g/dL (ref 3.5–5.2)
ALT: 35 U/L (ref 0–35)
AST: 54 U/L — ABNORMAL HIGH (ref 0–37)
Alkaline Phosphatase: 117 U/L (ref 39–117)
BILIRUBIN DIRECT: 0.1 mg/dL (ref 0.0–0.3)
BILIRUBIN TOTAL: 0.4 mg/dL (ref 0.2–1.2)
TOTAL PROTEIN: 7.6 g/dL (ref 6.0–8.3)

## 2014-11-10 LAB — CBC WITH DIFFERENTIAL/PLATELET
BASOS ABS: 0 10*3/uL (ref 0.0–0.1)
Basophils Relative: 0.4 % (ref 0.0–3.0)
Eosinophils Absolute: 0.3 10*3/uL (ref 0.0–0.7)
Eosinophils Relative: 4.2 % (ref 0.0–5.0)
HEMATOCRIT: 40 % (ref 36.0–46.0)
Hemoglobin: 13.2 g/dL (ref 12.0–15.0)
LYMPHS ABS: 1.5 10*3/uL (ref 0.7–4.0)
LYMPHS PCT: 21.1 % (ref 12.0–46.0)
MCHC: 33 g/dL (ref 30.0–36.0)
MCV: 89.5 fl (ref 78.0–100.0)
Monocytes Absolute: 0.6 10*3/uL (ref 0.1–1.0)
Monocytes Relative: 8.4 % (ref 3.0–12.0)
NEUTROS PCT: 65.9 % (ref 43.0–77.0)
Neutro Abs: 4.7 10*3/uL (ref 1.4–7.7)
PLATELETS: 235 10*3/uL (ref 150.0–400.0)
RBC: 4.47 Mil/uL (ref 3.87–5.11)
RDW: 14.3 % (ref 11.5–15.5)
WBC: 7.2 10*3/uL (ref 4.0–10.5)

## 2014-11-10 LAB — HEMOGLOBIN A1C: HEMOGLOBIN A1C: 5.9 % (ref 4.6–6.5)

## 2014-11-10 LAB — TSH: TSH: 1.95 u[IU]/mL (ref 0.35–4.50)

## 2014-11-10 LAB — VITAMIN B12: VITAMIN B 12: 374 pg/mL (ref 211–911)

## 2014-11-10 MED ORDER — LOSARTAN POTASSIUM-HCTZ 100-25 MG PO TABS: 1.0000 | ORAL_TABLET | Freq: Every day | ORAL | Status: DC

## 2014-11-10 NOTE — Progress Notes (Signed)
   Subjective:    Patient ID: Janice Ridgelheresa D Bright, female    DOB: 12/20/1955, 59 y.o.   MRN: [id number]  HPI Here for 2 things. First she has had urinary burning and urgency for the past week. No fever. She drinks lotos water. Also for the past month she has had generalized numbness and tingling over the body, including arms, legs, feet, trunk, and even the face. No recent dietary changes.    Review of Systems  Constitutional: Negative.   Respiratory: Negative.   Cardiovascular: Negative.   Genitourinary: Positive for dysuria, urgency and frequency. Negative for flank pain and pelvic pain.  Neurological: Positive for numbness. Negative for dizziness, tremors, seizures, syncope, facial asymmetry, speech difficulty, weakness, light-headedness and headaches.       Objective:   Physical Exam  Constitutional: She is oriented to person, place, and time. She appears well-developed and well-nourished.  Neck: No thyromegaly present.  Cardiovascular: Normal rate, regular rhythm, normal heart sounds and intact distal pulses.   Pulmonary/Chest: Effort normal and breath sounds normal.  Abdominal: Soft. Bowel sounds are normal. She exhibits no distension and no mass. There is no tenderness. There is no rebound and no guarding.  Musculoskeletal: She exhibits no edema.  Lymphadenopathy:    She has no cervical adenopathy.  Neurological: She is alert and oriented to person, place, and time. She has normal reflexes. No cranial nerve deficit. She exhibits normal muscle tone. Coordination normal.  Skin: No rash noted.          Assessment & Plan:  Treat the UTI with Macrobid. To investigate the neuropathy we will get labs today.

## 2014-12-23 ENCOUNTER — Encounter: Payer: Self-pay | Admitting: Gastroenterology

## 2015-01-03 ENCOUNTER — Ambulatory Visit: Payer: BLUE CROSS/BLUE SHIELD | Admitting: Family Medicine

## 2015-01-04 ENCOUNTER — Ambulatory Visit (INDEPENDENT_AMBULATORY_CARE_PROVIDER_SITE_OTHER): Payer: BLUE CROSS/BLUE SHIELD | Admitting: Family Medicine

## 2015-01-04 ENCOUNTER — Other Ambulatory Visit: Payer: Self-pay

## 2015-01-04 ENCOUNTER — Encounter: Payer: Self-pay | Admitting: Family Medicine

## 2015-01-04 VITALS — BP 173/111 | HR 89 | Temp 98.4°F | Wt 254.8 lb

## 2015-01-04 DIAGNOSIS — M542 Cervicalgia: Secondary | ICD-10-CM

## 2015-01-04 NOTE — Progress Notes (Signed)
   Subjective:    Patient ID: Janice Bright, female    DOB: 10/12/1955, 59 y.o.   MRN: [id number]  HPI    Review of Systems     Objective:   Physical Exam        Assessment & Plan:  Pre visit review using our clinic review tool, if applicable. No additional management support is needed unless otherwise documented below in the visit note.

## 2015-01-04 NOTE — Progress Notes (Signed)
   Subjective:    Patient ID: Janice Bright, female    DOB: 06/25/1955, 59 y.o.   MRN: [id number]  HPI Here for 3 months of intermittent pains in the posterior neck and upper back. These are positional and she can bend certain ways to make it better or worse. Sometimes sharp pains radiate down the left arm. No hx of trauma. Using heat and Ibuprofen. Of note we recently saw her for generalized numbness and tingling, but the workup (including labs) was unrevealing.    Review of Systems  Constitutional: Negative.   Musculoskeletal: Positive for neck pain and neck stiffness.       Objective:   Physical Exam  Constitutional: She appears well-developed and well-nourished. No distress.  Musculoskeletal:  She is quite tender over the lower posterior neck, no crepitus and ROM is full           Assessment & Plan:  Neck pain, possibly disc related. Set up an MRI soon

## 2015-01-12 ENCOUNTER — Telehealth: Payer: Self-pay | Admitting: Family Medicine

## 2015-01-12 MED ORDER — AMLODIPINE BESYLATE 5 MG PO TABS: 5.0000 mg | ORAL_TABLET | Freq: Every day | ORAL | Status: DC

## 2015-01-12 NOTE — Telephone Encounter (Signed)
Spoke with MD K and he requested patient to start Amlodipin 5 mg PO 1 tab/daily, 30 day supply and to schedule follow-up appointment with MD Clent Ridges next week. Rx was sent in and appointment was made for Tuesday, August 2nd at 8:30am. Patient was called and is aware of instructions and appointment.

## 2015-01-12 NOTE — Telephone Encounter (Signed)
Patient Name: Janice Bright DOB: [birth year] Initial Comment Caller states has been monitoring BP , it is running high, needs another med called in go with the one she is on. BP 168/111 this morning. Tingling all over Nurse Assessment Nurse: Elijah Birk, RN, Stark Bray Date/Time (Eastern Time): 01/12/2015 11:59:30 AM Confirm and document reason for call. If symptomatic, describe symptoms. ---Caller states has been monitoring BP, it is running high. She needs another med. called in go with the one she is on. BP 168/111 this morning. Tingling all over. Has the patient traveled out of the country within the last 30 days? ---Not Applicable Does the patient require triage? ---Yes Nurse: Elijah Birk, RN, Lynda Date/Time (Eastern Time): 01/12/2015 12:29:23 PM Confirm and document reason for call. If symptomatic, describe symptoms. ---Caller states has been monitoring BP , it is running high, needs another med called in go with the one she is on. BP 168/111 this morning. Tingling feeling in her face & hands. Has an appt. for an MRI Sat. am, they are already working on that. Has the patient traveled out of the country within the last 30 days? ---Not Applicable Does the patient require triage? ---Yes Related visit to physician within the last 2 weeks? ---Yes Does the PT have any chronic conditions? (i.e. diabetes, asthma, etc.) ---Yes List chronic conditions. ---BP rx, tingly sensations over the last couple months Guidelines Guideline Title Affirmed Question Affirmed Notes High Blood Pressure [1] BP # 160 / 100 AND [2] cardiac or neurologic symptoms (e.g., chest pain, difficulty breathing, unsteady gait, blurred vision) Final Disposition User Go to ED Now Elijah Birk, RN, Stark Bray Comments Caller is going to go get her BP taken by the nurse, asked this nurse to call back within 30 mins. Caller states she has also had headache & some blurry vision, but states she is staring at a computer screen at work.  Unable to get another BP reading at this time as nurse is out until 1 pm. Her BP reading was from 7:30 am. She feels if her Dr. would put her back on the BP rx she was taken off of, things may resolve. Nurse will notify office of ER outcome. Patient is at work, and would  Comments prefer that [facility] [address] NKDA. Spoke with office staff & made aware of patient's refusal to be seen in ER. Referrals GO TO FACILITY UNDECIDED Disagree/Comply: Disagree Disagree/Comply Reason: Disagree with instructions

## 2015-01-14 ENCOUNTER — Ambulatory Visit
Admission: RE | Admit: 2015-01-14 | Discharge: 2015-01-14 | Disposition: A | Discharge status: Home / Self Care | Payer: BLUE CROSS/BLUE SHIELD | Source: Ambulatory Visit | Attending: Family Medicine | Admitting: Family Medicine

## 2015-01-14 DIAGNOSIS — M542 Cervicalgia: Secondary | ICD-10-CM

## 2015-01-16 NOTE — Addendum Note (Signed)
Addended by: Gershon Crane A on: 01/16/2015 08:44 AM   Modules accepted: Orders

## 2015-01-17 ENCOUNTER — Ambulatory Visit (INDEPENDENT_AMBULATORY_CARE_PROVIDER_SITE_OTHER): Payer: BLUE CROSS/BLUE SHIELD | Admitting: Family Medicine

## 2015-01-17 ENCOUNTER — Encounter: Payer: Self-pay | Admitting: Family Medicine

## 2015-01-17 VITALS — BP 149/97 | HR 90 | Temp 98.6°F | Ht 63.5 in | Wt 253.0 lb

## 2015-01-17 DIAGNOSIS — M542 Cervicalgia: Secondary | ICD-10-CM | POA: Diagnosis not present

## 2015-01-17 DIAGNOSIS — I10 Essential (primary) hypertension: Secondary | ICD-10-CM | POA: Diagnosis not present

## 2015-01-17 NOTE — Progress Notes (Signed)
Pre visit review using our clinic review tool, if applicable. No additional management support is needed unless otherwise documented below in the visit note. 

## 2015-01-17 NOTE — Progress Notes (Signed)
   Subjective:    Patient ID: Janice Bright, female    DOB: 12/20/1955, 59 y.o.   MRN: [id number]  HPI Here to recheck HTN and to follow up on neck pain. 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 We did a referral to Neurosurgery and she is waiting to hear back on this. We also added Amlodipine to her Hyzaar for high BP, and she has tolerated this well however her BP has not responded yet. She checks this at work daily.    Review of Systems  Constitutional: Negative.   Respiratory: Negative.   Cardiovascular: Negative.   Musculoskeletal: Positive for neck pain and neck stiffness.  Neurological: Negative.        Objective:   Physical Exam  Constitutional: She is oriented to person, place, and time. She appears well-developed and well-nourished.  Neck: No thyromegaly present.  Cardiovascular: Normal rate, regular rhythm, normal heart sounds and intact distal pulses.   Pulmonary/Chest: Effort normal and breath sounds normal.  Musculoskeletal: Normal range of motion. She exhibits no tenderness.  Lymphadenopathy:    She has no cervical adenopathy.  Neurological: She is alert and oriented to person, place, and time.          Assessment & Plan:  For the neck pain, she is waiting to hear back about the neurosurgery appt. For the HTN, I reassured her that  It is still too early to see much of a response from the amlodipine. This may take another week or so to become effective. She will continue to monitor this closely.

## 2015-03-01 ENCOUNTER — Other Ambulatory Visit: Payer: Self-pay | Admitting: Internal Medicine

## 2015-05-15 ENCOUNTER — Other Ambulatory Visit: Payer: Self-pay

## 2015-05-15 MED ORDER — AMLODIPINE BESYLATE 5 MG PO TABS: 5.0000 mg | ORAL_TABLET | Freq: Every day | ORAL | Status: DC

## 2015-07-05 ENCOUNTER — Other Ambulatory Visit: Payer: Self-pay | Admitting: Family Medicine

## 2015-12-14 DIAGNOSIS — Z205 Contact with and (suspected) exposure to viral hepatitis: Secondary | ICD-10-CM | POA: Diagnosis not present

## 2015-12-26 ENCOUNTER — Telehealth: Payer: Self-pay | Admitting: Cardiovascular Disease

## 2015-12-26 NOTE — Telephone Encounter (Signed)
Received records from [facility] for appointment on 01/30/16 with Dr Allyson SabalBerry.  Records given to [facility] LPN Hines (medical records) for Dr Hazle CocaBerry's schedule on 01/30/16. lp

## 2016-01-06 ENCOUNTER — Other Ambulatory Visit: Payer: Self-pay | Admitting: Family Medicine

## 2016-01-30 ENCOUNTER — Ambulatory Visit: Payer: BLUE CROSS/BLUE SHIELD | Admitting: Cardiovascular Disease

## 2016-02-02 ENCOUNTER — Encounter: Payer: Self-pay | Admitting: Cardiovascular Disease

## 2016-02-02 ENCOUNTER — Telehealth: Payer: Self-pay | Admitting: Cardiovascular Disease

## 2016-02-12 NOTE — Telephone Encounter (Signed)
Closed encounter °

## 2016-02-14 ENCOUNTER — Ambulatory Visit: Payer: BLUE CROSS/BLUE SHIELD | Admitting: Cardiovascular Disease

## 2016-03-20 ENCOUNTER — Ambulatory Visit: Payer: BLUE CROSS/BLUE SHIELD | Admitting: Cardiovascular Disease

## 2016-04-02 ENCOUNTER — Encounter (HOSPITAL_COMMUNITY): Payer: Self-pay

## 2016-04-02 ENCOUNTER — Emergency Department (HOSPITAL_COMMUNITY)
Admission: EM | Admit: 2016-04-02 | Discharge: 2016-04-02 | Disposition: A | Discharge status: Other Acute Inpatient Hospital | Payer: BLUE CROSS/BLUE SHIELD | Attending: Emergency Medicine | Admitting: Emergency Medicine

## 2016-04-02 ENCOUNTER — Encounter (HOSPITAL_COMMUNITY): Payer: Self-pay | Admitting: Emergency Medicine

## 2016-04-02 ENCOUNTER — Inpatient Hospital Stay (HOSPITAL_COMMUNITY)
Admission: AD | Admit: 2016-04-02 | Discharge: 2016-04-06 | DRG: 882 | Disposition: A | Discharge status: Home / Self Care | Payer: BLUE CROSS/BLUE SHIELD | Source: Intra-hospital | Attending: Psychiatry | Admitting: Psychiatry

## 2016-04-02 DIAGNOSIS — R45850 Homicidal ideations: Secondary | ICD-10-CM | POA: Diagnosis present

## 2016-04-02 DIAGNOSIS — F10129 Alcohol abuse with intoxication, unspecified: Secondary | ICD-10-CM | POA: Insufficient documentation

## 2016-04-02 DIAGNOSIS — F10929 Alcohol use, unspecified with intoxication, unspecified: Secondary | ICD-10-CM

## 2016-04-02 DIAGNOSIS — F4323 Adjustment disorder with mixed anxiety and depressed mood: Secondary | ICD-10-CM | POA: Diagnosis present

## 2016-04-02 DIAGNOSIS — R45851 Suicidal ideations: Secondary | ICD-10-CM | POA: Diagnosis present

## 2016-04-02 DIAGNOSIS — I10 Essential (primary) hypertension: Secondary | ICD-10-CM | POA: Insufficient documentation

## 2016-04-02 DIAGNOSIS — Z800 Family history of malignant neoplasm of digestive organs: Secondary | ICD-10-CM | POA: Diagnosis not present

## 2016-04-02 DIAGNOSIS — F329 Major depressive disorder, single episode, unspecified: Secondary | ICD-10-CM | POA: Diagnosis present

## 2016-04-02 DIAGNOSIS — F1094 Alcohol use, unspecified with alcohol-induced mood disorder: Secondary | ICD-10-CM | POA: Diagnosis present

## 2016-04-02 DIAGNOSIS — Y906 Blood alcohol level of 120-199 mg/100 ml: Secondary | ICD-10-CM | POA: Diagnosis present

## 2016-04-02 DIAGNOSIS — Z8249 Family history of ischemic heart disease and other diseases of the circulatory system: Secondary | ICD-10-CM | POA: Diagnosis not present

## 2016-04-02 DIAGNOSIS — Z79899 Other long term (current) drug therapy: Secondary | ICD-10-CM | POA: Diagnosis not present

## 2016-04-02 DIAGNOSIS — Z833 Family history of diabetes mellitus: Secondary | ICD-10-CM | POA: Diagnosis not present

## 2016-04-02 LAB — RAPID URINE DRUG SCREEN, HOSP PERFORMED
Amphetamines: NOT DETECTED
BARBITURATES: NOT DETECTED
Benzodiazepines: NOT DETECTED
Cocaine: NOT DETECTED
Opiates: NOT DETECTED
TETRAHYDROCANNABINOL: NOT DETECTED

## 2016-04-02 LAB — COMPREHENSIVE METABOLIC PANEL
ALK PHOS: 112 U/L (ref 38–126)
ALT: 34 U/L (ref 14–54)
ANION GAP: 11 (ref 5–15)
AST: 61 U/L — ABNORMAL HIGH (ref 15–41)
Albumin: 4.1 g/dL (ref 3.5–5.0)
BILIRUBIN TOTAL: 0.6 mg/dL (ref 0.3–1.2)
BUN: 15 mg/dL (ref 6–20)
CALCIUM: 9.4 mg/dL (ref 8.9–10.3)
CO2: 24 mmol/L (ref 22–32)
CREATININE: 1.08 mg/dL — AB (ref 0.44–1.00)
Chloride: 106 mmol/L (ref 101–111)
GFR calc non Af Amer: 54 mL/min — ABNORMAL LOW (ref >60)
GLUCOSE: 119 mg/dL — AB (ref 65–99)
Potassium: 3.8 mmol/L (ref 3.5–5.1)
SODIUM: 141 mmol/L (ref 135–145)
TOTAL PROTEIN: 7.6 g/dL (ref 6.5–8.1)

## 2016-04-02 LAB — CBC
HEMATOCRIT: 38.8 % (ref 36.0–46.0)
HEMOGLOBIN: 12.8 g/dL (ref 12.0–15.0)
MCH: 29.4 pg (ref 26.0–34.0)
MCHC: 33 g/dL (ref 30.0–36.0)
MCV: 89 fL (ref 78.0–100.0)
Platelets: 224 10*3/uL (ref 150–400)
RBC: 4.36 MIL/uL (ref 3.87–5.11)
RDW: 13.7 % (ref 11.5–15.5)
WBC: 5.8 10*3/uL (ref 4.0–10.5)

## 2016-04-02 LAB — SALICYLATE LEVEL

## 2016-04-02 LAB — ACETAMINOPHEN LEVEL

## 2016-04-02 LAB — ETHANOL: Alcohol, Ethyl (B): 182 mg/dL — ABNORMAL HIGH (ref <5)

## 2016-04-02 MED ORDER — LOSARTAN POTASSIUM 50 MG PO TABS
100.0000 mg | ORAL_TABLET | Freq: Every day | ORAL | Status: DC
  Administered 2016-04-03 – 2016-04-06 (×4): 100 mg via ORAL
  Filled 2016-04-02 (×7): qty 2

## 2016-04-02 MED ORDER — AMLODIPINE BESYLATE 5 MG PO TABS
5.0000 mg | ORAL_TABLET | Freq: Every day | ORAL | Status: DC
  Administered 2016-04-03 – 2016-04-05 (×3): 5 mg via ORAL
  Filled 2016-04-02 (×7): qty 1

## 2016-04-02 MED ORDER — ONDANSETRON 4 MG PO TBDP: 4.0000 mg | ORAL_TABLET | Freq: Four times a day (QID) | ORAL | Status: AC | PRN

## 2016-04-02 MED ORDER — HYDROCHLOROTHIAZIDE 25 MG PO TABS
25.0000 mg | ORAL_TABLET | Freq: Every day | ORAL | Status: DC
  Administered 2016-04-03 – 2016-04-06 (×4): 25 mg via ORAL
  Filled 2016-04-02 (×7): qty 1

## 2016-04-02 MED ORDER — HYDROXYZINE HCL 25 MG PO TABS: 25.0000 mg | ORAL_TABLET | Freq: Four times a day (QID) | ORAL | Status: AC | PRN

## 2016-04-02 MED ORDER — NICOTINE 21 MG/24HR TD PT24: 21.0000 mg | MEDICATED_PATCH | Freq: Every day | TRANSDERMAL | Status: DC | PRN

## 2016-04-02 MED ORDER — IBUPROFEN 600 MG PO TABS
600.0000 mg | ORAL_TABLET | Freq: Four times a day (QID) | ORAL | Status: DC | PRN
  Administered 2016-04-03 – 2016-04-05 (×2): 600 mg via ORAL
  Filled 2016-04-02 (×2): qty 1

## 2016-04-02 MED ORDER — ACETAMINOPHEN 325 MG PO TABS: 650.0000 mg | ORAL_TABLET | ORAL | Status: DC | PRN

## 2016-04-02 MED ORDER — VITAMIN B-1 100 MG PO TABS
100.0000 mg | ORAL_TABLET | Freq: Every day | ORAL | Status: DC
Start: 2016-04-03 — End: 2016-04-06
  Administered 2016-04-03 – 2016-04-06 (×4): 100 mg via ORAL
  Filled 2016-04-02 (×7): qty 1

## 2016-04-02 MED ORDER — LOSARTAN POTASSIUM-HCTZ 100-25 MG PO TABS: 1.0000 | ORAL_TABLET | Freq: Every day | ORAL | Status: DC

## 2016-04-02 MED ORDER — TRAZODONE HCL 50 MG PO TABS
50.0000 mg | ORAL_TABLET | Freq: Every evening | ORAL | Status: DC | PRN
  Administered 2016-04-02 – 2016-04-05 (×3): 50 mg via ORAL
  Filled 2016-04-02 (×13): qty 1

## 2016-04-02 MED ORDER — LORAZEPAM 1 MG PO TABS
1.0000 mg | ORAL_TABLET | Freq: Three times a day (TID) | ORAL | Status: AC
Start: 2016-04-04 — End: 2016-04-05
  Administered 2016-04-04 (×2): 1 mg via ORAL
  Filled 2016-04-02 (×2): qty 1

## 2016-04-02 MED ORDER — LORAZEPAM 1 MG PO TABS
1.0000 mg | ORAL_TABLET | Freq: Every day | ORAL | Status: AC
  Administered 2016-04-05 – 2016-04-06 (×2): 1 mg via ORAL

## 2016-04-02 MED ORDER — ACETAMINOPHEN 325 MG PO TABS: 650.0000 mg | ORAL_TABLET | Freq: Four times a day (QID) | ORAL | Status: DC | PRN

## 2016-04-02 MED ORDER — THIAMINE HCL 100 MG/ML IJ SOLN: 100.0000 mg | Freq: Once | INTRAMUSCULAR | Status: DC

## 2016-04-02 MED ORDER — AMLODIPINE BESYLATE 5 MG PO TABS
5.0000 mg | ORAL_TABLET | Freq: Every day | ORAL | Status: DC
  Administered 2016-04-02: 5 mg via ORAL
  Filled 2016-04-02: qty 1

## 2016-04-02 MED ORDER — ESCITALOPRAM OXALATE 10 MG PO TABS
10.0000 mg | ORAL_TABLET | Freq: Every day | ORAL | Status: DC
  Administered 2016-04-03 – 2016-04-06 (×4): 10 mg via ORAL
  Filled 2016-04-02 (×7): qty 1

## 2016-04-02 MED ORDER — LOPERAMIDE HCL 2 MG PO CAPS: 2.0000 mg | ORAL_CAPSULE | ORAL | Status: AC | PRN

## 2016-04-02 MED ORDER — ESCITALOPRAM OXALATE 10 MG PO TABS
10.0000 mg | ORAL_TABLET | Freq: Every day | ORAL | Status: DC
  Administered 2016-04-02: 10 mg via ORAL
  Filled 2016-04-02: qty 1

## 2016-04-02 MED ORDER — ALUM & MAG HYDROXIDE-SIMETH 200-200-20 MG/5ML PO SUSP: 30.0000 mL | ORAL | Status: DC | PRN

## 2016-04-02 MED ORDER — LORAZEPAM 1 MG PO TABS
1.0000 mg | ORAL_TABLET | Freq: Four times a day (QID) | ORAL | Status: AC | PRN
  Filled 2016-04-02: qty 1

## 2016-04-02 MED ORDER — IBUPROFEN 200 MG PO TABS
600.0000 mg | ORAL_TABLET | Freq: Three times a day (TID) | ORAL | Status: DC | PRN
  Administered 2016-04-02: 600 mg via ORAL
  Filled 2016-04-02: qty 3

## 2016-04-02 MED ORDER — HYDROCHLOROTHIAZIDE 25 MG PO TABS: 25.0000 mg | ORAL_TABLET | Freq: Every day | ORAL | Status: DC

## 2016-04-02 MED ORDER — MAGNESIUM HYDROXIDE 400 MG/5ML PO SUSP: 30.0000 mL | Freq: Every day | ORAL | Status: DC | PRN

## 2016-04-02 MED ORDER — LORAZEPAM 1 MG PO TABS
1.0000 mg | ORAL_TABLET | Freq: Two times a day (BID) | ORAL | Status: AC
  Administered 2016-04-05 (×2): 1 mg via ORAL
  Filled 2016-04-02 (×2): qty 1

## 2016-04-02 MED ORDER — ADULT MULTIVITAMIN W/MINERALS CH
1.0000 | ORAL_TABLET | Freq: Every day | ORAL | Status: DC
Start: 2016-04-03 — End: 2016-04-06
  Administered 2016-04-03 – 2016-04-06 (×4): 1 via ORAL
  Filled 2016-04-02 (×7): qty 1

## 2016-04-02 MED ORDER — LOSARTAN POTASSIUM 50 MG PO TABS: 100.0000 mg | ORAL_TABLET | Freq: Every day | ORAL | Status: DC

## 2016-04-02 MED ORDER — ONDANSETRON HCL 4 MG PO TABS: 4.0000 mg | ORAL_TABLET | Freq: Three times a day (TID) | ORAL | Status: DC | PRN

## 2016-04-02 MED ORDER — ZOLPIDEM TARTRATE 5 MG PO TABS: 5.0000 mg | ORAL_TABLET | Freq: Every evening | ORAL | Status: DC | PRN

## 2016-04-02 MED ORDER — LORAZEPAM 1 MG PO TABS
1.0000 mg | ORAL_TABLET | Freq: Four times a day (QID) | ORAL | Status: AC
  Administered 2016-04-02 – 2016-04-03 (×5): 1 mg via ORAL
  Filled 2016-04-02 (×5): qty 1

## 2016-04-02 NOTE — ED Provider Notes (Signed)
[facility]-EMERGENCY DEPT Provider Note   CSN: [id number] Arrival date & time: 04/02/16  1043     History   Chief Complaint Chief Complaint  Patient presents with  . Suicidal  . Homicidal  . Alcohol Problem    HPI Janice Bright is a 60 y.o. female.  She presents for evaluation of alcoholism, alcohol intoxication, suicidal and homicidal ideation. Today the patient went to work, and was surprised that it was Tuesday and not Monday. She was supposed to be at work yesterday, Monday, but cannot remember anything that happened yesterday. She states that "I lost a day". She is here with her company nurse, who is acting as a support person, and concurs with the history. Patient reports she has had increased neck pain recently, and is status post neck surgery. She has had some tingling in the fingers of her left hand, which comes and goes. She denies fever, cough, nausea, vomiting, weakness or dizziness. There are no other known modifying factors.  HPI  Past Medical History:  Diagnosis Date  . Anxiety   . Colitis    sees Dr. Arlyce Dice  . Compression, spinal, spondylogenic, cervical   . Depression   . Gynecological examination    sees Dr. Beatrix Shipper  . Hypertension   . Normal eye exam    sees Dr. Mitzi Davenport   . Normal spontaneous vaginal delivery    3  . Sarcoidosis (HCC)    withlung involvement, sees Dr. Lovie Macadamia    Patient Active Problem List   Diagnosis Date Noted  . Diverticulitis of colon (without mention of hemorrhage)(562.11) 10/26/2013  . Sensation disturbance of skin 09/17/2011  . Swelling 09/17/2011  . NECK PAIN 09/26/2009  . NONSPECIFIC ABNORMAL FINDING IN STOOL CONTENTS 08/08/2009  . FLANK PAIN, LEFT 02/21/2009  . EDEMA 02/07/2009  . HEADACHE 11/03/2008  . BREAST PAIN, LEFT 08/30/2008  . RHINITIS, CHRONIC 07/01/2008  . GERD 04/28/2008  . DIARRHEA 04/07/2008  . DIVERTICULOSIS-COLON 03/24/2008  . RECTAL BLEEDING 03/24/2008  . ABDOMINAL PAIN-LLQ 03/24/2008  .  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 ACUTE BRONCHITIS 03/08/2008  . CANDIDIASIS OF VULVA AND VAGINA 08/04/2007  . DEPRESSION 08/04/2007  . Essential hypertension 08/04/2007  . ACUTE SINUSITIS, UNSPECIFIED 08/04/2007  . GALACTORRHEA 08/04/2007    Past Surgical History:  Procedure Laterality Date  . benign cyst removed from the right breast  2009  . COLONOSCOPY  08-09-09   per Dr. Arlyce Dice, diverticulosis only, repeat in 10 yrs   . OOPHORECTOMY    . TOTAL ABDOMINAL HYSTERECTOMY  BSO    OB History    No data available       Home Medications    Prior to Admission medications   Medication Sig Start Date End Date Taking? Authorizing Provider  amLODipine (NORVASC) 5 MG tablet Take 1 tablet (5 mg total) by mouth daily. Patient taking differently: Take 5 mg by mouth every morning.  05/15/15  Yes Gordy Savers, MD  escitalopram (LEXAPRO) 20 MG tablet TAKE 1/2 TABLET BY MOUTH DAILY Patient taking differently: Take 10 mg by mouth every morning 07/05/15  Yes Nelwyn Salisbury, MD  ibuprofen (ADVIL,MOTRIN) 200 MG tablet Take 400 mg by mouth every 6 (six) hours as needed for headache, mild pain or moderate pain.   Yes Historical Provider, MD  losartan-hydrochlorothiazide (HYZAAR) 100-25 MG tablet TAKE 1 TABLET BY MOUTH DAILY. Patient taking differently: Take 1 tablet by mouth every morning 01/08/16  Yes Nelwyn Salisbury, MD  VITAMIN D, ERGOCALCIFEROL, PO Take 1,000 Units by  mouth every morning.    Yes Historical Provider, MD    Family History Family History  Problem Relation Age of Onset  . Heart disease Mother   . Diabetes Father   . Hypertension Other   . Cancer Other     colon    Social History Social History  Substance Use Topics  . Smoking status: Never Smoker  . Smokeless tobacco: Never Used  . Alcohol use 1.2 oz/week    2 Standard drinks or equivalent per week     Comment: occ     Allergies   Review of patient's allergies indicates no known allergies.   Review of  Systems Review of Systems  All other systems reviewed and are negative.    Physical Exam Updated Vital Signs BP 143/95 (BP Location: Left Arm)   Pulse 83   Temp 98.2 F (36.8 C) (Oral)   Resp 18   SpO2 93%   Physical Exam  Constitutional: She is oriented to person, place, and time. She appears well-developed and well-nourished.  HENT:  Head: Normocephalic and atraumatic.  Eyes: Conjunctivae and EOM are normal. Pupils are equal, round, and reactive to light.  Neck: Normal range of motion and phonation normal. Neck supple.  Cardiovascular: Normal rate and regular rhythm.   Pulmonary/Chest: Effort normal and breath sounds normal. She exhibits no tenderness.  Abdominal: Soft. She exhibits no distension. There is no tenderness. There is no guarding.  Musculoskeletal: Normal range of motion.  No pronator drift. Intact sensation hands, bilaterally. No tenderness of the cervical spine. Normal strength. Arms and legs bilaterally.  Neurological: She is alert and oriented to person, place, and time. She exhibits normal muscle tone.  Skin: Skin is warm and dry.  Psychiatric: Her behavior is normal. Judgment and thought content normal.  Anxious  Nursing note and vitals reviewed.    ED Treatments / Results  Labs (all labs ordered are listed, but only abnormal results are displayed) Labs Reviewed  COMPREHENSIVE METABOLIC PANEL - Abnormal; Notable for the following:       Result Value   Glucose, Bld 119 (*)    Creatinine, Ser 1.08 (*)    AST 61 (*)    GFR calc non Af Amer 54 (*)    All other components within normal limits  ETHANOL - Abnormal; Notable for the following:    Alcohol, Ethyl (B) 182 (*)    All other components within normal limits  ACETAMINOPHEN LEVEL - Abnormal; Notable for the following:    Acetaminophen (Tylenol), Serum <10 (*)    All other components within normal limits  SALICYLATE LEVEL  CBC  RAPID URINE DRUG SCREEN, HOSP PERFORMED    EKG  EKG  Interpretation None       Radiology No results found.  Procedures Procedures (including critical care time)  Medications Ordered in ED Medications  acetaminophen (TYLENOL) tablet 650 mg (not administered)  ibuprofen (ADVIL,MOTRIN) tablet 600 mg (not administered)  zolpidem (AMBIEN) tablet 5 mg (not administered)  nicotine (NICODERM CQ - dosed in mg/24 hours) patch 21 mg (not administered)  ondansetron (ZOFRAN) tablet 4 mg (not administered)  alum & mag hydroxide-simeth (MAALOX/MYLANTA) 200-200-20 MG/5ML suspension 30 mL (not administered)  amLODipine (NORVASC) tablet 5 mg (not administered)  escitalopram (LEXAPRO) tablet 10 mg (not administered)  losartan (COZAAR) tablet 100 mg (not administered)    And  hydrochlorothiazide (HYDRODIURIL) tablet 25 mg (not administered)     Initial Impression / Assessment and Plan / ED Course  I have reviewed  the triage vital signs and the nursing notes.  Pertinent labs & imaging results that were available during my care of the patient were reviewed by me and considered in my medical decision making (see chart for details).  Clinical Course  Comment By Time  At this point, she is medically cleared for treatment by psychiatry. Mancel Bale, MD 10/17 1254    Medications  acetaminophen (TYLENOL) tablet 650 mg (not administered)  ibuprofen (ADVIL,MOTRIN) tablet 600 mg (not administered)  zolpidem (AMBIEN) tablet 5 mg (not administered)  nicotine (NICODERM CQ - dosed in mg/24 hours) patch 21 mg (not administered)  ondansetron (ZOFRAN) tablet 4 mg (not administered)  alum & mag hydroxide-simeth (MAALOX/MYLANTA) 200-200-20 MG/5ML suspension 30 mL (not administered)  amLODipine (NORVASC) tablet 5 mg (not administered)  escitalopram (LEXAPRO) tablet 10 mg (not administered)  losartan (COZAAR) tablet 100 mg (not administered)    And  hydrochlorothiazide (HYDRODIURIL) tablet 25 mg (not administered)    Patient Vitals for the past 24 hrs:   BP Temp Temp src Pulse Resp SpO2  04/02/16 1531 143/95 98.2 F (36.8 C) Oral 83 18 93 %  04/02/16 1058 155/98 97.8 F (36.6 C) Oral 90 17 95 %      Final Clinical Impressions(s) / ED Diagnoses   Final diagnoses:  Homicidal ideation  Suicidal ideation  Alcoholic intoxication with complication (HCC)    SI/HI with alcohol intoxication  Nursing Notes Reviewed/ Care Coordinated Applicable Imaging Reviewed Interpretation of Laboratory Data incorporated into ED treatment  Plan- as per TTS and oncoming previders   New Prescriptions New Prescriptions   No medications on file     Mancel Bale, MD 04/02/16 1702

## 2016-04-02 NOTE — Progress Notes (Signed)
D: Pt was in her room upon initial approach.  Pt presents with appropriate affect and depressed mood.  Pt reports she is here because she was "drinking and I lost a day, I blacked out."  Pt reports she drinks "a bottle of wine a day."  Pt denies SI/HI, denies hallucinations, denies pain, denies withdrawal symptoms.  Pt has been in her room for the majority of the night.    A: Introduced self to pt.  Actively listened to pt and offered support and encouragement. Medications administered per order.  On-site provider contacted for admission orders.  Medication administered per order.    R: Pt is safe on the unit.  Pt is compliant with medications.  Pt verbally contracts for safety.  Will continue to monitor and assess.
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
## 2016-04-02 NOTE — Tx Team (Signed)
Initial Treatment Plan 04/02/2016 9:41 PM Janice Bright MVH:[id number]    PATIENT STRESSORS: Marital or family conflict Occupational concerns Substance abuse   PATIENT STRENGTHS: Average or above average intelligence Capable of independent living Communication skills General fund of knowledge Motivation for treatment/growth   PATIENT IDENTIFIED PROBLEMS: Depression   Anxiety  Substance abuse  "Work on no drinking"  "Sleep better-I can't sleep without wine"             DISCHARGE CRITERIA:  Improved stabilization in mood, thinking, and/or behavior Verbal commitment to aftercare and medication compliance  PRELIMINARY DISCHARGE PLAN: Outpatient therapy Medication management  PATIENT/FAMILY INVOLVEMENT: This treatment plan has been presented to and reviewed with the patient, Janice Bright.  The patient and family have been given the opportunity to ask questions and make suggestions.  Levin BaconHeather V Clytee Heinrich, RN 04/02/2016, 9:41 PM

## 2016-04-02 NOTE — Progress Notes (Deleted)
Patient has a bed at [facility] at 6:30pm 307-2. Admitting physician is Dr. Oats and number for report is [phone].   Brinda Focht, LCSWA Clinical Social Worker [phone]  

## 2016-04-02 NOTE — BH Assessment (Signed)
[facility] Assessment Progress Note  Per Nanine MeansJamison Lord, DNP, this pt requires psychiatric hospitalization at this time.  Lillia AbedLindsay, RN, [facility]; they will be ready to receive pt at 17:30.  Pt has signed Voluntary Admission and Consent for Treatment, as well as Consent to Release Information to her son and to the accompanying nurse from her place of employment, and [facility].  Pt's ED nurse, Vernona RiegerLaura, has been notified, and agrees to send original paperwork along with pt via [facility], and to call report to [phone].  Doylene Canninghomas Loise Esguerra, MA Triage Specialist [phone]
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
## 2016-04-02 NOTE — ED Notes (Signed)
[facility] [place] [facility]
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
## 2016-04-02 NOTE — ED Notes (Signed)
Bed: [facility] Expected date:  Expected time:  Means of arrival:  Comments: 

## 2016-04-03 DIAGNOSIS — Z833 Family history of diabetes mellitus: Secondary | ICD-10-CM

## 2016-04-03 DIAGNOSIS — Z800 Family history of malignant neoplasm of digestive organs: Secondary | ICD-10-CM

## 2016-04-03 DIAGNOSIS — F4323 Adjustment disorder with mixed anxiety and depressed mood: Principal | ICD-10-CM | POA: Diagnosis present

## 2016-04-03 DIAGNOSIS — Z8249 Family history of ischemic heart disease and other diseases of the circulatory system: Secondary | ICD-10-CM

## 2016-04-03 DIAGNOSIS — Z79899 Other long term (current) drug therapy: Secondary | ICD-10-CM

## 2016-04-03 LAB — LIPID PANEL
CHOLESTEROL: 217 mg/dL — AB (ref 0–200)
HDL: 50 mg/dL (ref >40)
LDL Cholesterol: 129 mg/dL — ABNORMAL HIGH (ref 0–99)
Total CHOL/HDL Ratio: 4.3 RATIO
Triglycerides: 189 mg/dL — ABNORMAL HIGH (ref <150)
VLDL: 38 mg/dL (ref 0–40)

## 2016-04-03 LAB — TSH: TSH: 2.702 u[IU]/mL (ref 0.350–4.500)

## 2016-04-03 NOTE — BHH Counselor (Signed)
Adult Comprehensive Assessment  Patient ID: Janice Ridgelheresa D Bright, female   DOB: 12/19/1955, 60 y.o.   MRN: [id number]  Information Source: Information source: Patient  Current Stressors:  Educational / Learning stressors: some college Employment / Job issues: employed for past 25 years Family Relationships: close to 3 adult sons Surveyor, quantityinancial / Lack of resources (include bankruptcy): employed full time and Sales promotion account executiveprivate insurance Housing / Lack of housing: lives "in bad area" alone Physical health (include injuries & life threatening diseases): hypeterension Social relationships: close to some friends; family, and coworkers Substance abuse: alcohol-heavy drinking but "I have been drinking less lately--I used to drink hard liquor now I drink mostly wine." Bereavement / Loss: going through divorce with husband.   Living/Environment/Situation:  Living Arrangements: Alone Living conditions (as described by patient or guardian): "the house is falling apart there is alot to fix; lives alone; "bad area." pt reports feeling unsafe. How long has patient lived in current situation?: several years What is atmosphere in current home: Dangerous (I bought a gun in June because I live in a bad neighborhood and I have it safely locked up in a box at home.)  Family History:  Marital status: Separated Separated, when?: Mar 18, 2015-divorce is in process. had been married for 40 years What types of issues is patient dealing with in the relationship?: "My depression got really bad and my husband would not help fix the house or assist with the bills." Additional relationship information: Pt reported passive HI toward husband upon admission. during assessment, pt states that she has gun to protect herself in her neighborhood, not to kill her husband.,  Are you sexually active?: No What is your sexual orientation?: heterosexual Has your sexual activity been affected by drugs, alcohol, medication, or emotional stress?: n/a   Does patient have children?: Yes How many children?: 3 How is patient's relationship with their children?: 3 sons and five grandchildren--great relationship.   Childhood History:  By whom was/is the patient raised?: Both parents Additional childhood history information: good childhood Description of patient's relationship with caregiver when they were a child: close to parents Patient's description of current relationship with people who raised him/her: parents deceased Does patient have siblings?: Yes Number of Siblings: 1 Description of patient's current relationship with siblings: close to sibling Did patient suffer any verbal/emotional/physical/sexual abuse as a child?: No Did patient suffer from severe childhood neglect?: No Has patient ever been sexually abused/assaulted/raped as an adolescent or adult?: No Was the patient ever a victim of a crime or a disaster?:  (n/a) Witnessed domestic violence?: No Has patient been effected by domestic violence as an adult?: No  Education:  Highest grade of school patient has completed: some college Currently a student?: No Name of school: n/a  Learning disability?: No  Employment/Work Situation:   Employment situation: Employed Where is patient currently employed?: [employer] for 25 years.  How long has patient been employed?: 25 years  Patient's job has been impacted by current illness: Yes Describe how patient's job has been impacted: "I missed work on Monday because I had been drinking heavily."  What is the longest time patient has a held a job?: see above Where was the patient employed at that time?: see above Has patient ever been in the Eli Lilly and Companymilitary?: No Has patient ever served in combat?: No Did You Receive Any Psychiatric Treatment/Services While in Equities traderthe Military?: No Are There Guns or Other Weapons in Your Home?: Yes Types of Guns/Weapons: pt owns gun; has been taking lessons "I  live in a bad area."  Are These Weapons Safely  Secured?: Yes ("i have a safety box." That was just anger talking. I don't want to shoot my husband or hurt myself. )  Financial Resources:   Financial resources: Income from employment, Private insurance Does patient have a representative payee or guardian?: No  Alcohol/Substance Abuse:   What has been your use of drugs/alcohol within the last 12 months?: alcohol daily; I drink wine everyday but have been cutting back recently. no other drug use.  If attempted suicide, did drugs/alcohol play a role in this?: No Alcohol/Substance Abuse Treatment Hx: Past Tx, Outpatient If yes, describe treatment: dr. Lyda Kalata for medication management in the past.  Has alcohol/substance abuse ever caused legal problems?: No  Social Support System:   Patient's Community Support System: Fair Museum/gallery exhibitions officer System: some friends and coworkers that pt is close to.  Type of faith/religion: Ephriam Knuckles How does patient's faith help to cope with current illness?: church  Leisure/Recreation:   Leisure and Hobbies: spending time with the sons and grandkids  Strengths/Needs:   What things does the patient do well?: hard worker; rock for family In what areas does patient struggle / problems for patient: depression; anger toward exhusband  Discharge Plan:   Does patient have access to transportation?: Yes Will patient be returning to same living situation after discharge?: Yes If no, would patient like referral for services when discharged?: Yes (What county?) Medical sales representative) Does patient have financial barriers related to discharge medications?: No (pt has Media planner.)  Summary/Recommendations:   Emergency planning/management officer and Recommendations (to be completed by the evaluator): Patient is 60 year old female living in [place], [place] She presents voluntarily to the hospital seeking treatment for alcohol abuse/detox, medication stabilization, depression, and was endorsing SI and HI toward husband upon  admission. Patient has since stated that she is not HI or SI and has gun that is in safe box in her home. Patient reports that she bought gun and has been taking lessons because of unsafe living area. Patient is employed and plans to return to work at discharge. SHe is in the process of divorcing her husband of 40 years and has three adult sons. Recommendations for patient include: Crisis stabilization, therapeutic milieu, encourage group attendance and participation, medication management for withdrawals/mood stabilization, and development of comprehensive mental wellness/sobriety plan. Pt is interested in getting assessment with the [facility] by discharge.    Janice Bright [employer]. 04/03/2016 12:32 PM
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
## 2016-04-03 NOTE — Progress Notes (Signed)
Pt attended NA meeting this evening.  

## 2016-04-03 NOTE — Progress Notes (Signed)
Recreation Therapy Notes  Date: 04/03/16 Time: 0930 Location: [address] Dayroom  Group Topic: Stress Management  Goal Area(s) Addresses:  Patient will verbalize importance of using healthy stress management.  Patient will identify positive emotions associated with healthy stress management.   Behavioral Response: Engaged  Intervention: Stress Management  Activity :  Progressive Muscle Relaxation.  LRT introduced the stress management technique of progressive muscle relaxation.  LRT read a script to allow patients to participate in activity.  Patients were to follow along as LRT read script.  Education:  Stress Management, Discharge Planning.   Education Outcome: Acknowledges edcuation/In group clarification offered/Needs additional education  Clinical Observations/Feedback: Pt attended group.    Caroll RancherMarjette Blanca Carreon, LRT/CTRS         Lillia AbedLindsay, Babbette Dalesandro A 04/03/2016 2:32 PM

## 2016-04-03 NOTE — Progress Notes (Signed)
DAR NOTE: Pt present with flat affect and depressed mood in the unit. Pt has been visible in the day area with peers. Pt denies physical pain, took all her meds as scheduled. As per self inventory, pt had a good night sleep, good appetite, normal energy, and good concentration. Pt rate depression at 0, hopeless ness at 0, and anxiety at 1. Pt stated her goal for today is "how to stop drinking/getting out by Friday." Pt's safety ensured with 15 minute and environmental checks. Pt currently denies SI/HI and A/V hallucinations. Pt verbally agrees to seek staff if SI/HI or A/VH occurs and to consult with staff before acting on these thoughts. Will continue POC.

## 2016-04-03 NOTE — [facility] Group Notes (Signed)
[facility] LCSW Group Therapy  04/03/2016 4:17 PM  Type of Therapy:  Group Therapy  Participation Level:  Did Not Attend-pt invited. Chose to remain in room.   Summary of Progress/Problems: Today's Topic: Overcoming Obstacles. Patients identified one short term goal and potential obstacles in reaching this goal. Patients processed barriers involved in overcoming these obstacles. Patients identified steps necessary for overcoming these obstacles and explored motivation (internal and external) for facing these difficulties head on.   Stan Cantave N Smart LCSW 04/03/2016, 4:17 PM
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
## 2016-04-03 NOTE — Tx Team (Signed)
Interdisciplinary Treatment and Diagnostic Plan Update  04/03/2016 Time of Session: 9:30AM Ladona Ridgelheresa D Murphy MRN: [id number]  Principal Diagnosis: Adjustment disorder with mixed anxiety and depressed mood  Secondary Diagnoses: Principal Problem:   Adjustment disorder with mixed anxiety and depressed mood Active Problems:   Alcohol-induced mood disorder (HCC)   Current Medications:  Current Facility-Administered Medications  Medication Dose Route Frequency Provider Last Rate Last Dose  . acetaminophen (TYLENOL) tablet 650 mg  650 mg Oral Q6H PRN Kerry HoughSpencer E Simon, PA-C      . alum & mag hydroxide-simeth (MAALOX/MYLANTA) 200-200-20 MG/5ML suspension 30 mL  30 mL Oral Q4H PRN Kerry HoughSpencer E Simon, PA-C      . amLODipine (NORVASC) tablet 5 mg  5 mg Oral Daily Kerry HoughSpencer E Simon, PA-C   5 mg at 04/03/16 [id number]  . escitalopram (LEXAPRO) tablet 10 mg  10 mg Oral Daily Kerry HoughSpencer E Simon, PA-C   10 mg at 04/03/16 [id number]  . losartan (COZAAR) tablet 100 mg  100 mg Oral Daily Craige CottaFernando A Cobos, MD   100 mg at 04/03/16 0827   And  . hydrochlorothiazide (HYDRODIURIL) tablet 25 mg  25 mg Oral Daily Craige CottaFernando A Cobos, MD   25 mg at 04/03/16 [id number]  . hydrOXYzine (ATARAX/VISTARIL) tablet 25 mg  25 mg Oral Q6H PRN Kerry HoughSpencer E Simon, PA-C      . ibuprofen (ADVIL,MOTRIN) tablet 600 mg  600 mg Oral Q6H PRN Kerry HoughSpencer E Simon, PA-C      . loperamide (IMODIUM) capsule 2-4 mg  2-4 mg Oral PRN Kerry HoughSpencer E Simon, PA-C      . LORazepam (ATIVAN) tablet 1 mg  1 mg Oral Q6H PRN Kerry HoughSpencer E Simon, PA-C      . LORazepam (ATIVAN) tablet 1 mg  1 mg Oral QID Kerry HoughSpencer E Simon, PA-C   1 mg at 04/03/16 1153   Followed by  . [START ON 04/04/2016] LORazepam (ATIVAN) tablet 1 mg  1 mg Oral TID Kerry HoughSpencer E Simon, PA-C       Followed by  . [START ON 04/05/2016] LORazepam (ATIVAN) tablet 1 mg  1 mg Oral BID Kerry HoughSpencer E Simon, PA-C       Followed by  . [START ON 04/06/2016] LORazepam (ATIVAN) tablet 1 mg  1 mg Oral Daily Spencer E Simon, PA-C      . magnesium  hydroxide (MILK OF MAGNESIA) suspension 30 mL  30 mL Oral Daily PRN Kerry HoughSpencer E Simon, PA-C      . multivitamin with minerals tablet 1 tablet  1 tablet Oral Daily Kerry HoughSpencer E Simon, PA-C   1 tablet at 04/03/16 [id number]  . ondansetron (ZOFRAN-ODT) disintegrating tablet 4 mg  4 mg Oral Q6H PRN Kerry HoughSpencer E Simon, PA-C      . thiamine (B-1) injection 100 mg  100 mg Intramuscular Once IntelSpencer E Simon, PA-C      . thiamine (VITAMIN B-1) tablet 100 mg  100 mg Oral Daily Kerry HoughSpencer E Simon, PA-C   100 mg at 04/03/16 [id number]  . traZODone (DESYREL) tablet 50 mg  50 mg Oral QHS,MR X 1 Kerry HoughSpencer E Simon, PA-C   50 mg at 04/02/16 2158   PTA Medications: Prescriptions Prior to Admission  Medication Sig Dispense Refill Last Dose  . amLODipine (NORVASC) 5 MG tablet Take 1 tablet (5 mg total) by mouth daily. (Patient taking differently: Take 5 mg by mouth every morning. ) 90 tablet 4 04/02/2016 at 0730  . escitalopram (LEXAPRO) 20 MG tablet TAKE 1/2 TABLET BY MOUTH DAILY (Patient  taking differently: Take 10 mg by mouth every morning) 90 tablet 3 04/02/2016 at 0730  . ibuprofen (ADVIL,MOTRIN) 200 MG tablet Take 400 mg by mouth every 6 (six) hours as needed for headache, mild pain or moderate pain.   Past Week at Unknown time  . losartan-hydrochlorothiazide (HYZAAR) 100-25 MG tablet TAKE 1 TABLET BY MOUTH DAILY. (Patient taking differently: Take 1 tablet by mouth every morning) 90 tablet 0 04/02/2016 at 0730  . VITAMIN D, ERGOCALCIFEROL, PO Take 1,000 Units by mouth every morning.    04/02/2016 at 0730    Patient Stressors: Marital or family conflict Occupational concerns Substance abuse  Patient Strengths: Average or above average intelligence Capable of independent living Communication skills General fund of knowledge Motivation for treatment/growth  Treatment Modalities: Medication Management, Group therapy, Case management,  1 to 1 session with clinician, Psychoeducation, Recreational therapy.   Physician Treatment  Plan for Primary Diagnosis: Adjustment disorder with mixed anxiety and depressed mood Long Term Goal(s): Improvement in symptoms so as ready for discharge Improvement in symptoms so as ready for discharge   Short Term Goals: Ability to demonstrate self-control will improve Ability to identify and develop effective coping behaviors will improve Ability to identify changes in lifestyle to reduce recurrence of condition will improve Ability to demonstrate self-control will improve Ability to identify and develop effective coping behaviors will improve  Medication Management: Evaluate patient's response, side effects, and tolerance of medication regimen.  Therapeutic Interventions: 1 to 1 sessions, Unit Group sessions and Medication administration.  Evaluation of Outcomes: Progressing  Physician Treatment Plan for Secondary Diagnosis: Principal Problem:   Adjustment disorder with mixed anxiety and depressed mood Active Problems:   Alcohol-induced mood disorder (HCC)  Long Term Goal(s): Improvement in symptoms so as ready for discharge Improvement in symptoms so as ready for discharge   Short Term Goals: Ability to demonstrate self-control will improve Ability to identify and develop effective coping behaviors will improve Ability to identify changes in lifestyle to reduce recurrence of condition will improve Ability to demonstrate self-control will improve Ability to identify and develop effective coping behaviors will improve     Medication Management: Evaluate patient's response, side effects, and tolerance of medication regimen.  Therapeutic Interventions: 1 to 1 sessions, Unit Group sessions and Medication administration.  Evaluation of Outcomes: Progressing   RN Treatment Plan for Primary Diagnosis: Adjustment disorder with mixed anxiety and depressed mood Long Term Goal(s): Knowledge of disease and therapeutic regimen to maintain health will improve  Short Term Goals:  Ability to remain free from injury will improve, Ability to disclose and discuss suicidal ideas and Ability to identify and develop effective coping behaviors will improve  Medication Management: RN will administer medications as ordered by provider, will assess and evaluate patient's response and provide education to patient for prescribed medication. RN will report any adverse and/or side effects to prescribing provider.  Therapeutic Interventions: 1 on 1 counseling sessions, Psychoeducation, Medication administration, Evaluate responses to treatment, Monitor vital signs and CBGs as ordered, Perform/monitor CIWA, COWS, AIMS and Fall Risk screenings as ordered, Perform wound care treatments as ordered.  Evaluation of Outcomes: Progressing   LCSW Treatment Plan for Primary Diagnosis: Adjustment disorder with mixed anxiety and depressed mood Long Term Goal(s): Safe transition to appropriate next level of care at discharge, Engage patient in therapeutic group addressing interpersonal concerns.  Short Term Goals: Engage patient in aftercare planning with referrals and resources, Increase emotional regulation and Identify triggers associated with mental health/substance abuse issues  Therapeutic  Interventions: Assess for all discharge needs, 1 to 1 time with Child psychotherapist, Explore available resources and support systems, Assess for adequacy in community support network, Educate family and significant other(s) on suicide prevention, Complete Psychosocial Assessment, Interpersonal group therapy.  Evaluation of Outcomes: Progressing   Progress in Treatment: Attending groups: No. New to unit. Continuing to assess.  Participating in groups: No. Taking medication as prescribed: Yes. Toleration medication: Yes. Family/Significant other contact made: No, will contact:  family if pt consents. PATIENT ADMITTED TO HI THOUGHTS WITH A PLAN TO SHOOT HER HUSBAND AND THEN TAKE HER OWN LIFE UPON ADMISSION. SHE  HAS ACCESS TO GUN. Patient understands diagnosis: Yes. Discussing patient identified problems/goals with staff: Yes. Medical problems stabilized or resolved: Yes. Denies suicidal/homicidal ideation: No.Passive SI/HI. Able to contract for safety on the unit.  Issues/concerns per patient self-inventory: No. Other: n/a  New problem(s) identified: Yes, Describe:  please see above note re: HI toward husband. DUTY to warn will likely be required prior to pt discharge  Discharge Plan or Barriers: CSW assessing for appropriate referrals. Pt sees Dr. Lyda Kalata for psychiatry/medication management. No current therapist.   Reason for Continuation of Hospitalization: Depression Homicidal ideation Medication stabilization Suicidal ideation Withdrawal symptoms  Estimated Length of Stay: 3-5 days   Attendees: Patient: 04/03/2016 2:46 PM  Physician: Dr. Mckinley Jewel MD 04/03/2016 2:46 PM  Nursing: Irven Coe RN 04/03/2016 2:46 PM  RN Care Manager: Onnie Boer CM 04/03/2016 2:46 PM  Social Worker: Trula Slade, LCSW 04/03/2016 2:46 PM  Recreational Therapist:  04/03/2016 2:46 PM  Other:  04/03/2016 2:46 PM  Other:  04/03/2016 2:46 PM  Other: 04/03/2016 2:46 PM    Scribe for Treatment Team: Ledell Peoples Smart, LCSW 04/03/2016 2:46 PM

## 2016-04-04 LAB — PROLACTIN: PROLACTIN: 28.7 ng/mL — AB (ref 4.8–23.3)

## 2016-04-04 LAB — HEMOGLOBIN A1C
HEMOGLOBIN A1C: 5.7 % — AB (ref 4.8–5.6)
Mean Plasma Glucose: 117 mg/dL

## 2016-04-04 NOTE — Progress Notes (Signed)
Patient did attend the evening karaoke group. Pt was engaged and supportive but did not participate by singing a song.    

## 2016-04-04 NOTE — [facility] Group Notes (Signed)
Goals Group)  Date:  04/04/2016  Time:  0900  Type of Therapy:  Nurse Education  /  Goals Group:  The group is focused on teaching patients how to st attainable goals that will aid them in their recovery.  Participation Level:  Active  Participation Quality:  Appropriate  Affect:  Depressed  Cognitive:  Appropriate  Insight:  Appropriate  Engagement in Group:  Engaged  Modes of Intervention:  Education  Summary of Progress/Problems:  Janice Bright, Janice Bright 04/04/2016, 11:04 AM

## 2016-04-04 NOTE — Progress Notes (Signed)
[facility] MD Progress Note  04/04/2016 11:38 AM  Patient Active Problem List   Diagnosis Date Noted  . Adjustment disorder with mixed anxiety and depressed mood 04/03/2016  . Alcohol-induced mood disorder ([place]) 04/02/2016  . Diverticulitis of colon (without mention of hemorrhage)(562.11) 10/26/2013  . Sensation disturbance of skin 09/17/2011  . Swelling 09/17/2011  . NECK PAIN 09/26/2009  . NONSPECIFIC ABNORMAL FINDING IN STOOL CONTENTS 08/08/2009  . FLANK PAIN, LEFT 02/21/2009  . EDEMA 02/07/2009  . HEADACHE 11/03/2008  . BREAST PAIN, LEFT 08/30/2008  . RHINITIS, CHRONIC 07/01/2008  . GERD 04/28/2008  . DIARRHEA 04/07/2008  . DIVERTICULOSIS-COLON 03/24/2008  . RECTAL BLEEDING 03/24/2008  . ABDOMINAL PAIN-LLQ 03/24/2008  . 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 ACUTE BRONCHITIS 03/08/2008  . CANDIDIASIS OF VULVA AND VAGINA 08/04/2007  . DEPRESSION 08/04/2007  . Essential hypertension 08/04/2007  . ACUTE SINUSITIS, UNSPECIFIED 08/04/2007  . GALACTORRHEA 08/04/2007    Diagnosis:  Adjustment disorder and alcohol use disorder Subjective: Patient denies any current ideation plan or intent to kill her husband. She denies any suicidal or homicidal ideation plan or intent. She reports that she does not feel ready to leave tomorrow however secondary very to ongoing feelings of stress and wishing to solidify her defenses against falling back into homicidal thoughts. She feels she would be better served by waiting until Saturday and rescheduling her financial advisor appointment that she had scheduled for Friday. We met and discussed problem solving skills, follow-up for alcohol use disorder and some ways that she can access counseling through [facility] which could be very beneficial to her.  Objective: Well-developed well-nourished woman in no apparent distress weekly dressed and well groomed pleasant and appropriate speech and motor are within normal limits thought processes are linear and goal directed  thought content denies current homicidal ideations towards husband, mood is described as all right affect is somewhat anxious alert and oriented 3 IQ appears in average range insight and judgment are fair  Patient's cholesterol profile mildly elevated for cholesterol and triglycerides HbA1c was 5.7 random glucose check was 117     Current Facility-Administered Medications (Cardiovascular):  .  amLODipine (NORVASC) tablet 5 mg .  losartan (COZAAR) tablet 100 mg **AND** hydrochlorothiazide (HYDRODIURIL) tablet 25 mg     Current Facility-Administered Medications (Analgesics):  .  acetaminophen (TYLENOL) tablet 650 mg .  ibuprofen (ADVIL,MOTRIN) tablet 600 mg     Current Facility-Administered Medications (Other):  .  alum & mag hydroxide-simeth (MAALOX/MYLANTA) 200-200-20 MG/5ML suspension 30 mL .  escitalopram (LEXAPRO) tablet 10 mg .  hydrOXYzine (ATARAX/VISTARIL) tablet 25 mg .  loperamide (IMODIUM) capsule 2-4 mg .  LORazepam (ATIVAN) tablet 1 mg .  [COMPLETED] LORazepam (ATIVAN) tablet 1 mg **FOLLOWED BY** LORazepam (ATIVAN) tablet 1 mg **FOLLOWED BY** [START ON 04/05/2016] LORazepam (ATIVAN) tablet 1 mg **FOLLOWED BY** [START ON 04/06/2016] LORazepam (ATIVAN) tablet 1 mg .  magnesium hydroxide (MILK OF MAGNESIA) suspension 30 mL .  multivitamin with minerals tablet 1 tablet .  ondansetron (ZOFRAN-ODT) disintegrating tablet 4 mg .  thiamine (B-1) injection 100 mg .  thiamine (VITAMIN B-1) tablet 100 mg .  traZODone (DESYREL) tablet 50 mg  No current outpatient prescriptions on file.  Vital Signs:Blood pressure 122/78, pulse 95, temperature 97.8 F (36.6 C), resp. rate 18, height _0  (1.651 m), weight 106.6 kg (235 lb), SpO2 98 %.    Lab Results:  Results for orders placed or performed during the hospital encounter of 04/02/16 (from the past 48 hour(s))  TSH  Status: None   Collection Time: 04/03/16  6:20 AM  Result Value Ref Range   TSH 2.702 0.350 - 4.500 uIU/mL     Comment: Performed by a 3rd Generation assay with a functional sensitivity of <=0.01 uIU/mL. Performed at [facility]   Prolactin     Status: Abnormal   Collection Time: 04/03/16  6:20 AM  Result Value Ref Range   Prolactin 28.7 (H) 4.8 - 23.3 ng/mL    Comment: (NOTE) Performed At: [facility] [place], [place] [id number] Lindon Romp MD PO:[id number] Performed at [facility]   Lipid panel     Status: Abnormal   Collection Time: 04/03/16  6:20 AM  Result Value Ref Range   Cholesterol 217 (H) 0 - 200 mg/dL   Triglycerides 189 (H) <150 mg/dL   HDL 50 >40 mg/dL   Total CHOL/HDL Ratio 4.3 RATIO   VLDL 38 0 - 40 mg/dL   LDL Cholesterol 129 (H) 0 - 99 mg/dL    Comment:        Total Cholesterol/HDL:CHD Risk Coronary Heart Disease Risk Table                     Men   Women  1/2 Average Risk   3.4   3.3  Average Risk       5.0   4.4  2 X Average Risk   9.6   7.1  3 X Average Risk  23.4   11.0        Use the calculated Patient Ratio above and the CHD Risk Table to determine the patient's CHD Risk.        ATP III CLASSIFICATION (LDL):  <100     mg/dL   Optimal  100-129  mg/dL   Near or Above                    Optimal  130-159  mg/dL   Borderline  160-189  mg/dL   High  >190     mg/dL   Very High Performed at [facility]   Hemoglobin A1c     Status: Abnormal   Collection Time: 04/03/16  6:20 AM  Result Value Ref Range   Hgb A1c MFr Bld 5.7 (H) 4.8 - 5.6 %    Comment: (NOTE)         Pre-diabetes: 5.7 - 6.4         Diabetes: >6.4         Glycemic control for adults with diabetes: <7.0    Mean Plasma Glucose 117 mg/dL    Comment: (NOTE) Performed At: [facility] [place], [place] [id number] Lindon Romp MD YP:[id number] Performed at [facility]     Physical Findings: AIMS: Facial and Oral Movements Muscles of Facial Expression: None, normal Lips  and Perioral Area: None, normal Jaw: None, normal Tongue: None, normal,Extremity Movements Upper (arms, wrists, hands, fingers): None, normal Lower (legs, knees, ankles, toes): None, normal, Trunk Movements Neck, shoulders, hips: None, normal, Overall Severity Severity of abnormal movements (highest score from questions above): None, normal Incapacitation due to abnormal movements: None, normal Patient's awareness of abnormal movements (rate only patient's report): No Awareness, Dental Status Current problems with teeth and/or dentures?: No Does patient usually wear dentures?: No  CIWA:  CIWA-Ar Total: 3 COWS:      Assessment/Plan: Patient is improving but still feels a bit unsteady about her  mental status and plan is to continue to observe her for another 24-36 hours before releasing. She was informed about the duty to warn that may be ineffective in her case and she does not object to this. It is notable that she has very mild elevation of her hemoglobin A1c and mild cholesterol elevations and she should recheck these parameters in 3-6 months.  Linard Millers, MD 04/04/2016, 11:38 AM

## 2016-04-04 NOTE — Progress Notes (Signed)
D Janice Bright is sad, quiet and has spent much of the day contemplating what unraveled in her life to cause her rel;apse. She attends her scheduled groups and is taking her meds as planned.she says through out the day " I think I'm ok.ibuprofen dont think I'm having any withdrawaal.". AShe completed her daily assessment and on it she wrote she denied  SI today and she rated her depression, hopelessness and anxiety " 0/0/0", respectively. Her eyes become filled with tears during her goals goup this mornign and she becasme unable to speak. R Positive encouragement is offered by this nurse. Pt encouraged to stay midful...stay in the moment and to continue to work her program. Safety in place,.

## 2016-04-05 NOTE — Progress Notes (Signed)
D- Patient is pleasant with flat affect at times.  Brightens on approach. Patient currently denies SI, HI, and AVH.  Patient has c/o neck pain and a headache.  Patient was given ibuprofen which was effective in offering pain relief and given a heating pack to place on her neck.   Patient reports good sleep, good appetite, and no other complaints.  On patient self-inventory, patient denies any feelings of depression or hopelessness.  Patient rates her anxiety "1" with 10 being the worst.  Patient's goal for today is to "learn how to schedule the many unfinished tasks that are taking place in my life". A- Scheduled and PRN medications administered to patient, per MD orders. Support and encouragement provided.  Routine safety checks conducted every 15 minutes.  Patient informed to notify staff with problems or concerns. R- No adverse drug reactions noted. Patient contracts for safety at this time. Patient compliant with medications and treatment plan. Patient receptive, calm, and cooperative. Patient interacts well with others on the unit.  Patient remains safe at this time.

## 2016-04-05 NOTE — Progress Notes (Signed)
Recreation Therapy Notes  Date: 04/05/16 Time: 0930 Location: 300 Group Room  Group Topic: Stress Management  Goal Area(s) Addresses:  Patient will verbalize importance of using healthy stress management.  Patient will identify positive emotions associated with healthy stress management.   Intervention: Stress Management  Activity :  Peaceful Waves Imagery.  LRT introduced the stress management of guided imagery.  LRT read a script to allow patients to participate in the technique.  Patients were to follow along as LRT read script.  Education:  Stress Management, Discharge Planning.   Education Outcome: Acknowledges edcuation/In group clarification offered/Needs additional education  Clinical Observations/Feedback:  Pt did not attend group.    Vendela Troung, LRT/CTRS         Odella Appelhans A 04/05/2016 12:44 PM 

## 2016-04-05 NOTE — Progress Notes (Signed)
  [facility] :  Will you be returning to the same living situation after discharge:  Yes,  home At discharge, do you have transportation home?: Yes,  family member. PATIENT IS SCHEDULED FOR SATURDAY DISCHARGE PER DR. Mckinley JewelATES. Do you have the ability to pay for your medications: Yes,  [employer] private insurance.  Release of information consent forms completed and submitted to medical records by CSW. Patient to Follow up at: Follow-up Information    [facility] Follow up on 04/08/2016.   Why:  Appt on this date at 5:00PM for assessment with Mr. [facility]. Please bring insurance card to this appt. Thank you.  Contact information: [address] [place], [place] [address] Phone: [phone] Fax: [phone]          Next level of care provider [facility] Link:no  Safety Planning and Suicide Prevention discussed: Yes,  SPE completed with pt's son. Duty to warn completed with pt's exhusband. SPI pamphlet and Mobile Crisis information provided to pt.   Have you used any form of tobacco in the last 30 days? (Cigarettes, Smokeless Tobacco, Cigars, and/or Pipes): No  Has patient been referred to the Quitline?: N/A patient is not a smoker  Patient has been referred for addiction treatment: Yes  Darron Stuck N Smart LCSW 04/05/2016, 12:30 PM

## 2016-04-05 NOTE — Progress Notes (Signed)
[facility]  04/05/2016 1:29 PM  Patient Active Problem List   Diagnosis Date Noted  . Adjustment disorder with mixed anxiety and depressed mood 04/03/2016  . Alcohol-induced mood disorder (HCC) 04/02/2016  . Diverticulitis of colon (without mention of hemorrhage)(562.11) 10/26/2013  . Sensation disturbance of skin 09/17/2011  . Swelling 09/17/2011  . NECK PAIN 09/26/2009  . NONSPECIFIC ABNORMAL FINDING IN STOOL CONTENTS 08/08/2009  . FLANK PAIN, LEFT 02/21/2009  . EDEMA 02/07/2009  . HEADACHE 11/03/2008  . BREAST PAIN, LEFT 08/30/2008  . RHINITIS, CHRONIC 07/01/2008  . GERD 04/28/2008  . DIARRHEA 04/07/2008  . DIVERTICULOSIS-COLON 03/24/2008  . RECTAL BLEEDING 03/24/2008  . ABDOMINAL PAIN-LLQ 03/24/2008  . 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 ACUTE BRONCHITIS 03/08/2008  . CANDIDIASIS OF VULVA AND VAGINA 08/04/2007  . DEPRESSION 08/04/2007  . Essential hypertension 08/04/2007  . ACUTE SINUSITIS, UNSPECIFIED 08/04/2007  . GALACTORRHEA 08/04/2007    Diagnosis: Adjustment disorder, history homicidal ideations, alcohol use disorder  Subjective: Patient denies any plan or intent to harm her husband and denies any homicidal or suicidal ideations at this time. She states she plans to follow up with AA meetings, make changes to regularize her sleep schedule and follow up with EAP at work for supportive counseling.  Objective: Well developed well nourished woman in no apparent distress Neatly dressed and groomed speech and motor within normal limits thought processes linear and goal-directed thought content denies any homicidal or suicidal ideation, plan or intent, mood is described as good affect is mildly anxious alert and oriented 3 insight and judgment are fair IQ appears an average range     Current Facility-Administered Medications (Cardiovascular):  .  amLODipine (NORVASC) tablet 5 mg .  losartan (COZAAR) tablet 100 mg **AND** hydrochlorothiazide (HYDRODIURIL)  tablet 25 mg     Current Facility-Administered Medications (Analgesics):  .  acetaminophen (TYLENOL) tablet 650 mg .  ibuprofen (ADVIL,MOTRIN) tablet 600 mg     Current Facility-Administered Medications (Other):  .  alum & mag hydroxide-simeth (MAALOX/MYLANTA) 200-200-20 MG/5ML suspension 30 mL .  escitalopram (LEXAPRO) tablet 10 mg .  hydrOXYzine (ATARAX/VISTARIL) tablet 25 mg .  loperamide (IMODIUM) capsule 2-4 mg .  LORazepam (ATIVAN) tablet 1 mg .  [COMPLETED] LORazepam (ATIVAN) tablet 1 mg **FOLLOWED BY** [EXPIRED] LORazepam (ATIVAN) tablet 1 mg **FOLLOWED BY** LORazepam (ATIVAN) tablet 1 mg **FOLLOWED BY** [START ON 04/06/2016] LORazepam (ATIVAN) tablet 1 mg .  magnesium hydroxide (MILK OF MAGNESIA) suspension 30 mL .  multivitamin with minerals tablet 1 tablet .  ondansetron (ZOFRAN-ODT) disintegrating tablet 4 mg .  thiamine (B-1) injection 100 mg .  thiamine (VITAMIN B-1) tablet 100 mg .  traZODone (DESYREL) tablet 50 mg  No current outpatient prescriptions on file.  Vital Signs:Blood pressure 113/74, pulse (!) 103, temperature 98.1 F (36.7 C), temperature source Oral, resp. rate 20, height 5\' 5"  (1.651 m), weight 106.6 kg (235 lb), SpO2 98 %.    Lab Results: No results found for this or any previous visit (from the past 48 hour(s)).  Physical Findings: AIMS: Facial and Oral Movements Muscles of Facial Expression: None, normal Lips and Perioral Area: None, normal Jaw: None, normal Tongue: None, normal,Extremity Movements Upper (arms, wrists, hands, fingers): None, normal Lower (legs, knees, ankles, toes): None, normal, Trunk Movements Neck, shoulders, hips: None, normal, Overall Severity Severity of abnormal movements (highest score from questions above): None, normal Incapacitation due to abnormal movements: None, normal Patient's awareness of abnormal movements (rate only patient's report): No Awareness, Dental Status Current problems  with teeth and/or  dentures?: No Does patient usually wear dentures?: No  CIWA:  CIWA-Ar Total: 0 COWS:      Assessment/Plan: Patient continues to deny any homicidal ideations and appears to be improving and stabilized. She is fine with [place] warning her husband about any possible homicidal ideations. She has follow-up plans as above and will be disc discharged to outpatient follow-up tomorrow if present course continues.  Acquanetta Sit, MD 04/05/2016, 1:29 PM

## 2016-04-05 NOTE — [facility] Group Notes (Signed)
[facility] Group Notes:  (Nursing/MHT/Case Management/Adjunct)  Date:  04/05/2016  Time:  10:39 PM  Type of Therapy:  AA group  Participation Level:  Active  Participation Quality:  Appropriate and Attentive  Affect:  Appropriate  Cognitive:  Alert and Appropriate  Insight:  Appropriate  Engagement in Group:  Engaged  Modes of Intervention:  Support  Summary of Progress/Problems: Participated well  Andres Egeritchett, Samar Dass Hundley 04/05/2016, 10:39 PM

## 2016-04-05 NOTE — [facility] Group Notes (Signed)
[facility] LCSW Group Therapy  04/05/2016 3:53 PM  Type of Therapy:  Group Therapy  Participation Level:  Minimal  Participation Quality:  Attentive  Affect:  Appropriate  Cognitive:  Alert and Oriented  Insight:  Improving  Engagement in Therapy:  Improving  Modes of Intervention:  Confrontation, Discussion, Education, Problem-solving, Rapport Building, Socialization and Support  Summary of Progress/Problems: Emotion Regulation: This group focused on both positive and negative emotion identification and allowed group members to process ways to identify feelings, regulate negative emotions, and find healthy ways to manage internal/external emotions. Group members were asked to reflect on a time when their reaction to an emotion led to a negative outcome and explored how alternative responses using emotion regulation would have benefited them. Group members were also asked to discuss a time when emotion regulation was utilized when a negative emotion was experienced. Janice Bright was attentive and engaged during today's processing group. She did not actively participate in group discussion unless prompted by CSW but actively listened as others participated. She shared that she was feeling depressed and angry upon admission but is doing "much better" and feels ready for discharge tomorrow. Pt pleasant and calm during group. Improving insight.   Janice Bright 04/05/2016, 3:53 PM

## 2016-04-05 NOTE — [facility] Suicide Risk Assessment (Signed)
[facility] INPATIENT:  Family/Significant Other Suicide Prevention Education  Suicide Prevention Education:  Education Completed; Janice DeitersHorace Murphy Jr. (Patient's son) [phone] has been identified by the patient as the family member/significant other with whom the patient will be residing, and identified as the person(s) who will aid the patient in the event of a mental health crisis (suicidal ideations/suicide attempt).  With written consent from the patient, the family member/significant other has been provided the following suicide prevention education, prior to the and/or following the discharge of the patient.  The suicide prevention education provided includes the following:  Suicide risk factors  Suicide prevention and interventions  National Suicide Hotline telephone number  [facility] assessment telephone number  [facility] Emergency Assistance 911  [facility] and/or Residential Mobile Crisis Unit telephone number  Request made of family/significant other to:  Remove weapons (e.g., guns, rifles, knives), all items previously/currently identified as safety concern.    Remove drugs/medications (over-the-counter, prescriptions, illicit drugs), all items previously/currently identified as a safety concern.  The family member/significant other verbalizes understanding of the suicide prevention education information provided.  The family member/significant other agrees to remove the items of safety concern listed above.  Pt's son states no concerns regarding pt's scheduled discharge for Saturday 04/06/16 and verbalized understanding of aftercare plan.  Marleena Shubert N Smart LCSW 04/05/2016, 11:24 AM

## 2016-04-05 NOTE — Progress Notes (Signed)
DUTY TO WARN completed by CSW with pt's ex-husband: Janice Bright Sr. [phone]. He stated that she has made comments like that to him in the past and appreciates the call. Dr. Mckinley Jewelates notified Ms. Janice Bright that a DUTY TO WARN was necessary prior to her discharge and pt verbalized understanding of this. Mr. Janice Bright was also notified that the pt is no longer endorsing active plan or HI thoughts. "I was just angry as hell at him but I understand the consequences if I act on them and I'm not going to hurt anyone." --Janice Bright.   Trula SladeHeather Smart, MSW, LCSW Clinical Social Worker 04/05/2016 12:29 PM

## 2016-04-05 NOTE — [facility] Suicide Risk Assessment (Signed)
[facility]   Principal Problem: Adjustment disorder with mixed anxiety and depressed mood Discharge Diagnoses:  Patient Active Problem List   Diagnosis Date Noted  . Adjustment disorder with mixed anxiety and depressed mood [F43.23] 04/03/2016  . Alcohol-induced mood disorder (HCC) [F10.94] 04/02/2016  . Diverticulitis of colon (without mention of hemorrhage)(562.11) [K57.32] 10/26/2013  . Sensation disturbance of skin [R20.9] 09/17/2011  . Swelling [R60.9] 09/17/2011  . NECK PAIN [M54.2] 09/26/2009  . NONSPECIFIC ABNORMAL FINDING IN STOOL CONTENTS [R19.5] 08/08/2009  . FLANK PAIN, LEFT [R10.9] 02/21/2009  . EDEMA [R60.9] 02/07/2009  . HEADACHE [R51] 11/03/2008  . BREAST PAIN, LEFT [N64.4] 08/30/2008  . RHINITIS, CHRONIC [J31.0] 07/01/2008  . GERD [K21.9] 04/28/2008  . DIARRHEA [R19.7] 04/07/2008  . DIVERTICULOSIS-COLON [K57.30] 03/24/2008  . RECTAL BLEEDING [K62.5] 03/24/2008  . ABDOMINAL PAIN-LLQ [R10.32] 03/24/2008  . 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 ACUTE BRONCHITIS [J20.9] 03/08/2008  . CANDIDIASIS OF VULVA AND VAGINA [B37.3] 08/04/2007  . DEPRESSION [F32.9] 08/04/2007  . Essential hypertension [I10] 08/04/2007  . ACUTE SINUSITIS, UNSPECIFIED [J01.90] 08/04/2007  . GALACTORRHEA [N64.3] 08/04/2007    Total Time spent with patient: 15 minutes  Musculoskeletal: Strength & Muscle Tone: within normal limits Gait & Station: normal Patient leans: N/A  Psychiatric Specialty Exam: ROS  Blood pressure 113/74, pulse (!) 103, temperature 98.1 F (36.7 C), temperature source Oral, resp. rate 20, height 5\' 5"  (1.651 m), weight 106.6 kg (235 lb), SpO2 98 %.Body mass index is 39.11 kg/m.  General Appearance: Fairly Groomed  Patent attorneyye Contact::  Good  Speech:  Clear and Coherent409  Volume:  Normal  Mood:  Euthymic  Affect:  Appropriate and Congruent  Thought Process:  Coherent and Goal Directed  Orientation:  Full (Time, Place, and Person)   Thought Content:  Negative  Suicidal Thoughts:  No  Homicidal Thoughts:  No  Memory:  Negative  Judgement:  Fair  Insight:  Fair  Psychomotor Activity:  Normal  Concentration:  Good  Recall:  Good  Fund of Knowledge:Good  Language: Good  Akathisia:  No  Handed:  Right  AIMS (if indicated):     Assets:  Financial Resources/Insurance Housing Resilience  Sleep:  Number of Hours: 6.25  Cognition: WNL  ADL's:  Intact   Mental Status Per Nursing Assessment::   On Admission:  Thoughts of violence towards others  Demographic Factors:  NA  Loss Factors: Loss of significant relationship  Historical Factors: NA  Risk Reduction Factors:   Sense of responsibility to family, Employed and Positive coping skills or problem solving skills  Continued Clinical Symptoms:  Alcohol/Substance Abuse/Dependencies  Cognitive Features That Contribute To Risk:  None    Suicide Risk:  Minimal: No identifiable suicidal ideation.  Patients presenting with no risk factors but with morbid ruminations; may be classified as minimal risk based on the severity of the depressive symptoms.Patient was admitted for homicidal ideations but currently denies any homicidal ideation, plan or intent and appears to have made positive strides in improving her response to her divorce situation, and her alcohol use.  Follow-up Information    [facility] Follow up on 04/08/2016.   Why:  Appt on this date at 5:00PM for assessment with Mr. [facility]. Please bring insurance card to this appt. Thank you.  Contact information: [address] [place], [place] [address] Phone: [phone] Fax: [phone]          Plan Of Care/Follow-up recommendations:  Other:  Patient has made plans to follow-up with AA and  her employer's EAP program for outpatient treatment and she is encouraged to continue with these plans.  Acquanetta Sit, MD 04/05/2016, 3:11 PM

## 2016-04-05 NOTE — Tx Team (Signed)
Interdisciplinary Treatment and Diagnostic Plan Update  04/05/2016 Time of Session: 9:30AM Janice Bright MRN: [id number]  Principal Diagnosis: Adjustment disorder with mixed anxiety and depressed mood  Secondary Diagnoses: Principal Problem:   Adjustment disorder with mixed anxiety and depressed mood Active Problems:   Alcohol-induced mood disorder (HCC)   Current Medications:  Current Facility-Administered Medications  Medication Dose Route Frequency Provider Last Rate Last Dose  . acetaminophen (TYLENOL) tablet 650 mg  650 mg Oral Q6H PRN Laverle Hobby, PA-C      . alum & mag hydroxide-simeth (MAALOX/MYLANTA) 200-200-20 MG/5ML suspension 30 mL  30 mL Oral Q4H PRN Laverle Hobby, PA-C      . amLODipine (NORVASC) tablet 5 mg  5 mg Oral Daily Laverle Hobby, PA-C   5 mg at 04/05/16 0820  . escitalopram (LEXAPRO) tablet 10 mg  10 mg Oral Daily Laverle Hobby, PA-C   10 mg at 04/05/16 9417  . losartan (COZAAR) tablet 100 mg  100 mg Oral Daily Jenne Campus, MD   100 mg at 04/05/16 4081   And  . hydrochlorothiazide (HYDRODIURIL) tablet 25 mg  25 mg Oral Daily Jenne Campus, MD   25 mg at 04/05/16 4481  . hydrOXYzine (ATARAX/VISTARIL) tablet 25 mg  25 mg Oral Q6H PRN Laverle Hobby, PA-C      . ibuprofen (ADVIL,MOTRIN) tablet 600 mg  600 mg Oral Q6H PRN Laverle Hobby, PA-C   600 mg at 04/05/16 0820  . loperamide (IMODIUM) capsule 2-4 mg  2-4 mg Oral PRN Laverle Hobby, PA-C      . LORazepam (ATIVAN) tablet 1 mg  1 mg Oral Q6H PRN Laverle Hobby, PA-C      . LORazepam (ATIVAN) tablet 1 mg  1 mg Oral BID Laverle Hobby, PA-C   1 mg at 04/05/16 8563   Followed by  . [START ON 04/06/2016] LORazepam (ATIVAN) tablet 1 mg  1 mg Oral Daily Spencer E Simon, PA-C      . magnesium hydroxide (MILK OF MAGNESIA) suspension 30 mL  30 mL Oral Daily PRN Laverle Hobby, PA-C      . multivitamin with minerals tablet 1 tablet  1 tablet Oral Daily Laverle Hobby, PA-C   1 tablet at  04/05/16 0820  . ondansetron (ZOFRAN-ODT) disintegrating tablet 4 mg  4 mg Oral Q6H PRN Laverle Hobby, PA-C      . thiamine (B-1) injection 100 mg  100 mg Intramuscular Once [employer], PA-C      . thiamine (VITAMIN B-1) tablet 100 mg  100 mg Oral Daily Laverle Hobby, PA-C   100 mg at 04/05/16 0820  . traZODone (DESYREL) tablet 50 mg  50 mg Oral QHS,MR X 1 Laverle Hobby, PA-C   50 mg at 04/03/16 2207   PTA Medications: Prescriptions Prior to Admission  Medication Sig Dispense Refill Last Dose  . amLODipine (NORVASC) 5 MG tablet Take 1 tablet (5 mg total) by mouth daily. (Patient taking differently: Take 5 mg by mouth every morning. ) 90 tablet 4 04/02/2016 at 0730  . escitalopram (LEXAPRO) 20 MG tablet TAKE 1/2 TABLET BY MOUTH DAILY (Patient taking differently: Take 10 mg by mouth every morning) 90 tablet 3 04/02/2016 at 0730  . ibuprofen (ADVIL,MOTRIN) 200 MG tablet Take 400 mg by mouth every 6 (six) hours as needed for headache, mild pain or moderate pain.   Past Week at Unknown time  . losartan-hydrochlorothiazide (HYZAAR)  100-25 MG tablet TAKE 1 TABLET BY MOUTH DAILY. (Patient taking differently: Take 1 tablet by mouth every morning) 90 tablet 0 04/02/2016 at 0730  . VITAMIN D, ERGOCALCIFEROL, PO Take 1,000 Units by mouth every morning.    04/02/2016 at 0730    Patient Stressors: Marital or family conflict Occupational concerns Substance abuse  Patient Strengths: Average or above average intelligence Capable of independent living Communication skills General fund of knowledge Motivation for treatment/growth  Treatment Modalities: Medication Management, Group therapy, Case management,  1 to 1 session with clinician, Psychoeducation, Recreational therapy.   Physician Treatment Plan for Primary Diagnosis: Adjustment disorder with mixed anxiety and depressed mood Long Term Goal(s): Improvement in symptoms so as ready for discharge Improvement in symptoms so as ready for  discharge   Short Term Goals: Ability to demonstrate self-control will improve Ability to identify and develop effective coping behaviors will improve Ability to identify changes in lifestyle to reduce recurrence of condition will improve Ability to demonstrate self-control will improve Ability to identify and develop effective coping behaviors will improve  Medication Management: Evaluate patient's response, side effects, and tolerance of medication regimen.  Therapeutic Interventions: 1 to 1 sessions, Unit Group sessions and Medication administration.  Evaluation of Outcomes: Met  Physician Treatment Plan for Secondary Diagnosis: Principal Problem:   Adjustment disorder with mixed anxiety and depressed mood Active Problems:   Alcohol-induced mood disorder ([place])  Long Term Goal(s): Improvement in symptoms so as ready for discharge Improvement in symptoms so as ready for discharge   Short Term Goals: Ability to demonstrate self-control will improve Ability to identify and develop effective coping behaviors will improve Ability to identify changes in lifestyle to reduce recurrence of condition will improve Ability to demonstrate self-control will improve Ability to identify and develop effective coping behaviors will improve     Medication Management: Evaluate patient's response, side effects, and tolerance of medication regimen.  Therapeutic Interventions: 1 to 1 sessions, Unit Group sessions and Medication administration.  Evaluation of Outcomes: Met   RN Treatment Plan for Primary Diagnosis: Adjustment disorder with mixed anxiety and depressed mood Long Term Goal(s): Knowledge of disease and therapeutic regimen to maintain health will improve  Short Term Goals: Ability to remain free from injury will improve, Ability to disclose and discuss suicidal ideas and Ability to identify and develop effective coping behaviors will improve  Medication Management: RN will administer  medications as ordered by provider, will assess and evaluate patient's response and provide education to patient for prescribed medication. RN will report any adverse and/or side effects to prescribing provider.  Therapeutic Interventions: 1 on 1 counseling sessions, Psychoeducation, Medication administration, Evaluate responses to treatment, Monitor vital signs and CBGs as ordered, Perform/monitor CIWA, COWS, AIMS and Fall Risk screenings as ordered, Perform wound care treatments as ordered.  Evaluation of Outcomes: Met   LCSW Treatment Plan for Primary Diagnosis: Adjustment disorder with mixed anxiety and depressed mood Long Term Goal(s): Safe transition to appropriate next level of care at discharge, Engage patient in therapeutic group addressing interpersonal concerns.  Short Term Goals: Engage patient in aftercare planning with referrals and resources, Increase emotional regulation and Identify triggers associated with mental health/substance abuse issues  Therapeutic Interventions: Assess for all discharge needs, 1 to 1 time with Social worker, Explore available resources and support systems, Assess for adequacy in community support network, Educate family and significant other(s) on suicide prevention, Complete Psychosocial Assessment, Interpersonal group therapy.  Evaluation of Outcomes: Met   Progress in Treatment: Attending  groups: Intermittently  Participating in groups: Yes, when she attentds Taking medication as prescribed: Yes. Toleration medication: Yes. Family/Significant other contact made: SPE completed with pt's sone. DUTY TO WARN ALSO COMPLETED WITH PT'S [place] [phone]. PT AWARE BY DR. Sharolyn Douglas THAT THIS WAS NECESSARY DUE TO HER ENDORSEMENT OF HI DURING ADMISSION.  Patient understands diagnosis: Yes. Discussing patient identified problems/goals with staff: Yes. Medical problems stabilized or resolved: Yes. Denies suicidal/homicidal ideation:  Yes Issues/concerns per patient self-inventory: No. Other: n/a  New problem(s) identified: No. Pt is no longer endorsing HI toward husband and reports no SI.   Discharge Plan or Barriers: Pt agreeable to [facility] assessment and has been scheduled for next week. She plans to return home on Saturday.   Reason for Continuation of Hospitalization: Medication management   Estimated Length of Stay: 1 day (pt scheduled for Saturday discharge per Dr. Sharolyn Douglas).   Attendees: Patient: 04/05/2016 8:48 AM  Physician: Dr. Sharolyn Douglas MD 04/05/2016 8:48 AM  Nursing: Lavonna Rua RN 04/05/2016 8:48 AM  RN Care Manager:  04/05/2016 8:48 AM  Social Worker: Maxie Better, LCSW 04/05/2016 8:48 AM  Recreational Therapist:  04/05/2016 8:48 AM  Other:  04/05/2016 8:48 AM  Other:  04/05/2016 8:48 AM  Other: 04/05/2016 8:48 AM    Scribe for Treatment Team: Alexander, LCSW 04/05/2016 8:48 AM

## 2016-04-06 MED ORDER — TRAZODONE HCL 50 MG PO TABS: 50.0000 mg | ORAL_TABLET | Freq: Every evening | ORAL | 0 refills | Status: DC | PRN

## 2016-04-06 MED ORDER — ESCITALOPRAM OXALATE 10 MG PO TABS: 10.0000 mg | ORAL_TABLET | Freq: Every day | ORAL | 0 refills | Status: DC

## 2016-04-06 MED ORDER — LORAZEPAM 1 MG PO TABS
ORAL_TABLET | ORAL | Status: AC
  Filled 2016-04-06: qty 1

## 2016-04-06 NOTE — Progress Notes (Signed)
Discharge D- Patient verbalizes readiness for discharge: Denies SI/HI, is not psychotic or delusional. A- Discharge instructions read and discussed with patient.  All belongings returned to patient. R- Patient cooperative with discharge process.  Patient verbalizes understanding of discharge instructions.  Signed for return of belongings. Escorted to the lobby.

## 2016-04-06 NOTE — [facility] Group Notes (Signed)
[facility] LCSW Group Therapy Note  04/06/2016  At 10:10 - 11:10 AM  Type of Therapy and Topic:  Group Therapy: Avoiding Self-Sabotaging and Enabling Behaviors  Participation Level:  Active  Participation Quality:  Appropriate  Affect:  Appropriate  Cognitive:  Appropriate  Insight:  Developing/Improving  Engagement in Therapy:  Developing/Improving   Therapeutic models used: Cognitive Behavioral Therapy,  Person-Centered Therapy and Motivational Interviewing  Modes of Intervention:  Discussion, Exploration, Orientation, Rapport Building, Socialization and Support   Summary of Progress/Problems:  The main focus of today's process group was for the patient to identify ways in which they have in the past sabotaged their own recovery. Motivational Interviewing was utilized to identify motivation they may have for wanting to change. The Stages of Change were explained using a handout, and patients identified where they currently are with regard to stages of change. Patient shared about recent circumstances that brought her to knowledge she needed to come in for help. Patient reports she is in preparation stage for planning for her retirement years but by simply acknowledging this she experienced anxiety and reports she will usually use to avoid those feelings.    Janice Bernatherine C Clydia Nieves, LCSW

## 2016-04-06 NOTE — Discharge Summary (Signed)
Physician Discharge Summary Note  Patient:  Janice Bright is an 60 y.o., female MRN:  [id number] DOB:  05/17/1956 Patient phone:  [phone] (home)  Patient address:   [address] [place] [address],  Total Time spent with patient: 30 minutes  Date of Admission:  04/02/2016 Date of Discharge: 04/06/2016  Reason for Admission:PER HPI- Patient was admitted after she drank alcohol the past weekend and somehow lost today and showed up for work on Tuesday thinking it was Monday. She then expressed homicidal ideation towards her husband from whom she is separating and planning to obtain a divorce as she had provided most of the monetary support throughout the 70 year marriage but under [place] divorce laws he would be eligible for half her pension and half her assets. The patient felt this was very unfair. She reported a plan to shoot her husband. She reports buying a gun this summer which she states was also for protection because she lives in a bad neighborhood and taking lessons on how to use it. She still currently has the gun locked up in a gun case at home and she has ammunition. Patient reports that she saw a counselor in her 5s but otherwise has not followed up with mental health professionals. She states that she does have some depression and anxiety and is receiving Lexapro from her primary care provider. She denies any prior hospitalizations for psychiatry. The patient reports that she know she has had a problem with alcohol during her lifetime she reports that she has cut back and now chiefly drinks wine but when she was younger she would binge every weekend and spend the whole weekend in her room drinking hard liquor. She does endorse tolerance currently she is on an Ativan taper and denies withdrawal symptoms she is also giving up things for use and used despite consequences during her lifetime as well. She states she would be interested in having treatment for alcohol  use disorder. Currently the patient denies that she plans to kill her husband or has any homicidal ideation beyond her baseline anger. She states that she would not be able to stand going to jail if she shot him and "that's why he's going to live a long time" because she states she could not bring herself to kill herself to avoid prison. She states now "I'll do what's right. I'll let my lawyer do the talking." She states "I don't know" when asked how she felt her judgment would be if she was intoxicated however.   Principal Problem: Adjustment disorder with mixed anxiety and depressed mood Discharge Diagnoses: Patient Active Problem List   Diagnosis Date Noted  . Adjustment disorder with mixed anxiety and depressed mood [F43.23] 04/03/2016  . Alcohol-induced mood disorder (HCC) [F10.94] 04/02/2016  . Diverticulitis of colon (without mention of hemorrhage)(562.11) [K57.32] 10/26/2013  . Sensation disturbance of skin [R20.9] 09/17/2011  . Swelling [R60.9] 09/17/2011  . NECK PAIN [M54.2] 09/26/2009  . NONSPECIFIC ABNORMAL FINDING IN STOOL CONTENTS [R19.5] 08/08/2009  . FLANK PAIN, LEFT [R10.9] 02/21/2009  . EDEMA [R60.9] 02/07/2009  . HEADACHE [R51] 11/03/2008  . BREAST PAIN, LEFT [N64.4] 08/30/2008  . RHINITIS, CHRONIC [J31.0] 07/01/2008  . GERD [K21.9] 04/28/2008  . DIARRHEA [R19.7] 04/07/2008  . DIVERTICULOSIS-COLON [K57.30] 03/24/2008  . RECTAL BLEEDING [K62.5] 03/24/2008  . ABDOMINAL PAIN-LLQ [R10.32] 03/24/2008  . 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 ACUTE BRONCHITIS [J20.9] 03/08/2008  . CANDIDIASIS OF VULVA AND VAGINA [B37.3] 08/04/2007  . DEPRESSION [F32.9] 08/04/2007  .  Essential hypertension [I10] 08/04/2007  . ACUTE SINUSITIS, UNSPECIFIED [J01.90] 08/04/2007  . GALACTORRHEA [N64.3] 08/04/2007    Past Psychiatric History:   Past Medical History:  Past Medical History:  Diagnosis Date  . Anxiety   . Colitis    sees Dr. Arlyce DiceKaplan  . Compression, spinal,  spondylogenic, cervical   . Depression   . Gynecological examination    sees Dr. Beatrix ShipperFernadez  . Hypertension   . Normal eye exam    sees Dr. Mitzi DavenportBrewington   . Normal spontaneous vaginal delivery    3  . Sarcoidosis (HCC)    withlung involvement, sees Dr. Lovie MacadamiaWrght    Past Surgical History:  Procedure Laterality Date  . benign cyst removed from the right breast  2009  . COLONOSCOPY  08-09-09   per Dr. Arlyce DiceKaplan, diverticulosis only, repeat in 10 yrs   . OOPHORECTOMY    . TOTAL ABDOMINAL HYSTERECTOMY  BSO   Family History:  Family History  Problem Relation Age of Onset  . Heart disease Mother   . Diabetes Father   . Hypertension Other   . Cancer Other     colon   Family Psychiatric  History:  Social History:  History  Alcohol Use  . 1.2 oz/week  . 2 Standard drinks or equivalent per week    Comment: occ     History  Drug Use No    Social History   Social History  . Marital status: Married    Spouse name: N/A  . Number of children: N/A  . Years of education: N/A   Social History Main Topics  . Smoking status: Never Smoker  . Smokeless tobacco: Never Used  . Alcohol use 1.2 oz/week    2 Standard drinks or equivalent per week     Comment: occ  . Drug use: No  . Sexual activity: Not Currently   Other Topics Concern  . None   Social History Narrative  . None    Hospital Course:  Janice Bright was admitted for Adjustment disorder with mixed anxiety and depressed mood  and crisis management.  Pt was treated discharged with the medications listed below under Medication List.  Medical problems were identified and treated as needed.  Home medications were restarted as appropriate.  Improvement was monitored by observation and Janice Bright 's daily report of symptom reduction.  Emotional and mental status was monitored by daily self-inventory reports completed by Janice Bright and clinical staff.         Janice Bright was evaluated by the treatment team for  stability and plans for continued recovery upon discharge. Janice Bright 's motivation was an integral factor for scheduling further treatment. Employment, transportation, bed availability, health status, family support, and any pending legal issues were also considered during hospital stay. Pt was offered further treatment options upon discharge including but not limited to Residential, Intensive Outpatient, and Outpatient treatment.  Janice Bright will follow up with the services as listed below under Follow Up Information.     Upon completion of this admission the patient was both mentally and medically stable for discharge denying suicidal/homicidal ideation, auditory/visual/tactile hallucinations, delusional thoughts and paranoia.    Janice Bright responded well to treatment withwithout adverse effects. Initially, pt did complain of Leapro and trazodone with these medications, but this resolved. Pt demonstrated improvement without reported or observed adverse effects to the point of stability appropriate for outpatient management. Pertinent labs include: CMP and Lipid elevated, Hgb A1C  5.7(high), for which outpatient follow-up is necessary for lab recheck as mentioned below. Reviewed CBC, CMP, BAL, and UDS; all unremarkable aside from noted exceptions.   Physical Findings: AIMS: Facial and Oral Movements Muscles of Facial Expression: None, normal Lips and Perioral Area: None, normal Jaw: None, normal Tongue: None, normal,Extremity Movements Upper (arms, wrists, hands, fingers): None, normal Lower (legs, knees, ankles, toes): None, normal, Trunk Movements Neck, shoulders, hips: None, normal, Overall Severity Severity of abnormal movements (highest score from questions above): None, normal Incapacitation due to abnormal movements: None, normal Patient's awareness of abnormal movements (rate only patient's report): No Awareness, Dental Status Current problems with teeth and/or dentures?:  No Does patient usually wear dentures?: No  CIWA:  CIWA-Ar Total: 2 COWS:     Musculoskeletal: Strength & Muscle Tone: within normal limits Gait & Station: normal Patient leans: N/A  Psychiatric Specialty Exam: Physical Exam  Nursing note and vitals reviewed. Constitutional: She is oriented to person, place, and time.  Neurological: She is alert and oriented to person, place, and time.  Psychiatric: She has a normal mood and affect. Her behavior is normal.    Review of Systems  Psychiatric/Behavioral: Negative for depression (stable) and suicidal ideas. The patient is not nervous/anxious.     Blood pressure 99/63, pulse 98, temperature 98.7 F (37.1 C), resp. rate 18, height 5\' 5"  (1.651 m), weight 106.6 kg (235 lb), SpO2 98 %.Body mass index is 39.11 kg/m.    Have you used any form of tobacco in the last 30 days? (Cigarettes, Smokeless Tobacco, Cigars, and/or Pipes): No  Has this patient used any form of tobacco in the last 30 days? (Cigarettes, Smokeless Tobacco, Cigars, and/or Pipes) Yes, Yes, A prescription for an FDA-approved tobacco cessation medication was offered at discharge and the patient refused  Blood Alcohol level:  Lab Results  Component Value Date   ETH 182 (H) 04/02/2016    Metabolic Disorder Labs:  Lab Results  Component Value Date   HGBA1C 5.7 (H) 04/03/2016   MPG 117 04/03/2016   Lab Results  Component Value Date   PROLACTIN 28.7 (H) 04/03/2016   Lab Results  Component Value Date   CHOL 217 (H) 04/03/2016   TRIG 189 (H) 04/03/2016   HDL 50 04/03/2016   CHOLHDL 4.3 04/03/2016   VLDL 38 04/03/2016   LDLCALC 129 (H) 04/03/2016   LDLCALC 114 (H) 08/07/2012    See Psychiatric Specialty Exam and Suicide Risk Assessment completed by Attending Physician prior to discharge.  Discharge destination:  Home  Is patient on multiple antipsychotic therapies at discharge:  No   Has Patient had three or more failed trials of antipsychotic monotherapy by  history:  No  Recommended Plan for Multiple Antipsychotic Therapies: NA  Discharge Instructions    Diet - low sodium heart healthy    Complete by:  As directed    Discharge instructions    Complete by:  As directed    Take all medications as prescribed. Keep all follow-up appointments as scheduled.  Do not consume alcohol or use illegal drugs while on prescription medications. Report any adverse effects from your medications to your primary care provider promptly.  In the event of recurrent symptoms or worsening symptoms, call 911, a crisis hotline, or go to the nearest emergency department for evaluation.   Increase activity slowly    Complete by:  As directed        Medication List    STOP taking these medications   ibuprofen 200  MG tablet Commonly known as:  ADVIL,MOTRIN     TAKE these medications     Indication  amLODipine 5 MG tablet Commonly known as:  NORVASC Take 1 tablet (5 mg total) by mouth daily. What changed:  when to take this  Indication:  High Blood Pressure Disorder   escitalopram 10 MG tablet Commonly known as:  LEXAPRO Take 1 tablet (10 mg total) by mouth daily. Start taking on:  04/07/2016 What changed:  See the new instructions.  Indication:  Generalized Anxiety Disorder   losartan-hydrochlorothiazide 100-25 MG tablet Commonly known as:  HYZAAR TAKE 1 TABLET BY MOUTH DAILY. What changed:  See the new instructions.  Indication:  High Blood Pressure Disorder   traZODone 50 MG tablet Commonly known as:  DESYREL Take 1 tablet (50 mg total) by mouth at bedtime and may repeat dose one time if needed.  Indication:  Aggressive Behavior, Major Depressive Disorder   VITAMIN D (ERGOCALCIFEROL) PO Take 1,000 Units by mouth every morning.  Indication:  Vitamin D Deficiency      Follow-up Information    [facility] Follow up on 04/08/2016.   Why:  Appt on this date at 5:00PM for assessment with Mr. [facility]. Please bring insurance card to this appt.  Thank you.  Contact information: [address] [place], [place] [address] Phone: [phone] Fax: [phone]          Follow-up recommendations:  Activity:  as tolerated Diet:  heart healthy  Comments:  Take all medications as prescribed. Keep all follow-up appointments as scheduled.  Do not consume alcohol or use illegal drugs while on prescription medications. Report any adverse effects from your medications to your primary care provider promptly.  In the event of recurrent symptoms or worsening symptoms, call 911, a crisis hotline, or go to the nearest emergency department for evaluation.   Signed: Oneta Rack, NP 04/06/2016, 9:36 AM

## 2016-04-06 NOTE — [facility] Group Notes (Addendum)
Goals Group  Date:  04/06/2016  Time:  0900  Type of Therapy:  Nurse Education  /  Goals Group: The group focuses on teaching patients how to identify attainable goals that will help them in their recovery.  Participation Level:  ood  Participation Quality:  Attentive  Affect:  Appropriate  Cognitive:  Alert  Insight:  Appropriate  Engagement in Group:  Engaged  Modes of Intervention:  Discussion  Summary of Progress/Problems:  Rich BraveDuke, Zyan Coby Lynn 04/06/2016, 11:29 AM

## 2016-04-06 NOTE — Progress Notes (Signed)
D    Pt is pleasant on approach and cooperative   She is compliant with treatment    Reports minimal withdrawal symptoms    Interacts minimally with others but is appropriate    Endorses depression and anxiety A    Verbal support given    Medications administered and effectiveness monitored    Q 15 min checks R    Pt is safe at present time

## 2016-04-12 DIAGNOSIS — F419 Anxiety disorder, unspecified: Secondary | ICD-10-CM | POA: Diagnosis not present

## 2016-04-12 DIAGNOSIS — Z79899 Other long term (current) drug therapy: Secondary | ICD-10-CM | POA: Diagnosis not present

## 2016-04-23 ENCOUNTER — Ambulatory Visit: Payer: BLUE CROSS/BLUE SHIELD | Admitting: Cardiovascular Disease

## 2016-05-16 ENCOUNTER — Other Ambulatory Visit: Payer: Self-pay | Admitting: Internal Medicine

## 2016-05-17 ENCOUNTER — Other Ambulatory Visit: Payer: Self-pay | Admitting: Family Medicine

## 2016-05-27 ENCOUNTER — Other Ambulatory Visit: Payer: Self-pay | Admitting: Family Medicine

## 2016-05-28 ENCOUNTER — Other Ambulatory Visit: Payer: Self-pay | Admitting: Family Medicine

## 2016-05-28 NOTE — Telephone Encounter (Signed)
Denied.  Not seen since 01/2015.  Message sent to the pharmacy for the pt to call and schedule appt.

## 2016-08-06 DIAGNOSIS — Z79899 Other long term (current) drug therapy: Secondary | ICD-10-CM | POA: Diagnosis not present

## 2016-08-06 DIAGNOSIS — F411 Generalized anxiety disorder: Secondary | ICD-10-CM | POA: Diagnosis not present

## 2016-08-15 ENCOUNTER — Other Ambulatory Visit: Payer: Self-pay | Admitting: Internal Medicine

## 2016-08-28 ENCOUNTER — Telehealth: Payer: Self-pay | Admitting: Family Medicine

## 2016-08-28 NOTE — Telephone Encounter (Signed)
No refills until the OV

## 2016-08-28 NOTE — Telephone Encounter (Signed)
Pt request refill  escitalopram (LEXAPRO) 20 MG tablet  Pt not seen since 01/2015.  Advised pt she needs to make an appointment for this refill on Friday I made appt for Friday.  Is this ok? Or will the dr refill?  [id number] - [place], [place] - [address]

## 2016-08-30 ENCOUNTER — Encounter: Payer: Self-pay | Admitting: Family Medicine

## 2016-08-30 ENCOUNTER — Ambulatory Visit (INDEPENDENT_AMBULATORY_CARE_PROVIDER_SITE_OTHER): Payer: BLUE CROSS/BLUE SHIELD | Admitting: Family Medicine

## 2016-08-30 VITALS — BP 150/92 | Temp 98.4°F | Ht 65.0 in | Wt 246.0 lb

## 2016-08-30 DIAGNOSIS — I10 Essential (primary) hypertension: Secondary | ICD-10-CM

## 2016-08-30 MED ORDER — AMLODIPINE BESYLATE 5 MG PO TABS: 5.0000 mg | ORAL_TABLET | Freq: Every day | ORAL | 3 refills | Status: DC

## 2016-08-30 MED ORDER — LOSARTAN POTASSIUM-HCTZ 100-25 MG PO TABS: 1.0000 | ORAL_TABLET | Freq: Every day | ORAL | 3 refills | Status: DC

## 2016-08-30 NOTE — Progress Notes (Signed)
   Subjective:    Patient ID: Janice Bright, female    DOB: 03/14/1956, 61 y.o.   MRN: [id number]  HPI Here to follow up on BP. She feels fine in general. She has her BP checked occasionally at work and it is "always high". She is not sure of the readings. Of note she was to be taking both Amlodipine and Losartan HCT, but she ran out of Amlodipine last October and never had it refilled.    Review of Systems  Constitutional: Negative.   Respiratory: Negative.   Cardiovascular: Negative.   Neurological: Negative.        Objective:   Physical Exam  Constitutional: She is oriented to person, place, and time. She appears well-developed and well-nourished.  Cardiovascular: Normal rate, regular rhythm, normal heart sounds and intact distal pulses.   Pulmonary/Chest: Effort normal and breath sounds normal.  Musculoskeletal: She exhibits no edema.  Neurological: She is alert and oriented to person, place, and time.          Assessment & Plan:  Poorly controlled HTN. We will get her back on both medications. I encouraged her to exercise and lose weight. Recheck in 3-4 weeks.  Gershon CraneStephen Katricia Prehn, MD

## 2016-08-30 NOTE — Progress Notes (Signed)
Pre visit review using our clinic review tool, if applicable. No additional management support is needed unless otherwise documented below in the visit note. 

## 2016-08-30 NOTE — Patient Instructions (Signed)
WE NOW OFFER   [facility] FAST TRACK!!!  SAME DAY Appointments for ACUTE CARE  Such as: Sprains, Injuries, cuts, abrasions, rashes, muscle pain, joint pain, back pain Colds, flu, sore throats, headache, allergies, cough, fever  Ear pain, sinus and eye infections Abdominal pain, nausea, vomiting, diarrhea, upset stomach Animal/insect bites  3 Easy Ways to Schedule: Walk-In Scheduling Call in scheduling Mychart Sign-up: [email or web address]         

## 2016-09-02 ENCOUNTER — Other Ambulatory Visit: Payer: Self-pay | Admitting: Family Medicine

## 2016-09-24 ENCOUNTER — Encounter: Payer: Self-pay | Admitting: Gynecology

## 2016-09-24 ENCOUNTER — Ambulatory Visit (INDEPENDENT_AMBULATORY_CARE_PROVIDER_SITE_OTHER): Payer: BLUE CROSS/BLUE SHIELD | Admitting: Gynecology

## 2016-09-24 VITALS — BP 128/82 | Ht 64.0 in | Wt 245.2 lb

## 2016-09-24 DIAGNOSIS — L292 Pruritus vulvae: Secondary | ICD-10-CM | POA: Diagnosis not present

## 2016-09-24 DIAGNOSIS — Z79890 Hormone replacement therapy (postmenopausal): Secondary | ICD-10-CM

## 2016-09-24 DIAGNOSIS — B9689 Other specified bacterial agents as the cause of diseases classified elsewhere: Secondary | ICD-10-CM

## 2016-09-24 DIAGNOSIS — N898 Other specified noninflammatory disorders of vagina: Secondary | ICD-10-CM

## 2016-09-24 DIAGNOSIS — Z01411 Encounter for gynecological examination (general) (routine) with abnormal findings: Secondary | ICD-10-CM

## 2016-09-24 DIAGNOSIS — N952 Postmenopausal atrophic vaginitis: Secondary | ICD-10-CM

## 2016-09-24 DIAGNOSIS — Z780 Asymptomatic menopausal state: Secondary | ICD-10-CM

## 2016-09-24 DIAGNOSIS — N760 Acute vaginitis: Secondary | ICD-10-CM

## 2016-09-24 LAB — WET PREP FOR TRICH, YEAST, CLUE
CLUE CELLS WET PREP: NONE SEEN
Trich, Wet Prep: NONE SEEN
YEAST WET PREP: NONE SEEN

## 2016-09-24 MED ORDER — ESTRADIOL 10 MCG VA TABS: 1.0000 | ORAL_TABLET | VAGINAL | 11 refills | Status: DC

## 2016-09-24 MED ORDER — TINIDAZOLE 500 MG PO TABS: ORAL_TABLET | ORAL | 0 refills | Status: DC

## 2016-09-24 NOTE — Patient Instructions (Addendum)
Bone Densitometry Bone densitometry is an imaging test that uses a special X-ray to measure the amount of calcium and other minerals in your bones (bone density). This test is also known as a bone mineral density test or dual-energy X-ray absorptiometry (DXA). The test can measure bone density at your hip and your spine. It is similar to having a regular X-ray. You may have this test to:  Diagnose a condition that causes weak or thin bones (osteoporosis).  Predict your risk of a broken bone (fracture).  Determine how well osteoporosis treatment is working. Tell a health care provider about:  Any allergies you have.  All medicines you are taking, including vitamins, herbs, eye drops, creams, and over-the-counter medicines.  Any problems you or family members have had with anesthetic medicines.  Any blood disorders you have.  Any surgeries you have had.  Any medical conditions you have.  Possibility of pregnancy.  Any other medical test you had within the previous 14 days that used contrast material. What are the risks? Generally, this is a safe procedure. However, problems can occur and may include the following:  This test exposes you to a very small amount of radiation.  The risks of radiation exposure may be greater to unborn children. What happens before the procedure?  Do not take any calcium supplements for 24 hours before having the test. You can otherwise eat and drink what you usually do.  Take off all metal jewelry, eyeglasses, dental appliances, and any other metal objects. What happens during the procedure?  You may lie on an exam table. There will be an X-ray generator below you and an imaging device above you.  Other devices, such as boxes or braces, may be used to position your body properly for the scan.  You will need to lie still while the machine slowly scans your body.  The images will show up on a computer monitor. What happens after the  procedure? You may need more testing at a later time. This information is not intended to replace advice given to you by your health care provider. Make sure you discuss any questions you have with your health care provider. Document Released: 06/25/2004 Document Revised: 11/09/2015 Document Reviewed: 11/11/2013 Elsevier Interactive Patient Education  2017 Elsevier Inc. Estradiol vaginal tablets What is this medicine? ESTRADIOL (es tra DYE ole) vaginal tablet is used to help relieve symptoms of vaginal irritation and dryness that occurs in some women during menopause. This medicine may be used for other purposes; ask your health care provider or pharmacist if you have questions. COMMON BRAND NAME(S): Vagifem, Yuvafem What should I tell my health care provider before I take this medicine? They need to know if you have any of these conditions: -abnormal vaginal bleeding -blood vessel disease or blood clots -breast, cervical, endometrial, ovarian, liver, or uterine cancer -dementia -diabetes -gallbladder disease -heart disease or recent heart attack -high blood pressure -high cholesterol -high level of calcium in the blood -hysterectomy -kidney disease -liver disease -migraine headaches -protein C deficiency -protein S deficiency -stroke -systemic lupus erythematosus (SLE) -tobacco smoker -an unusual or allergic reaction to estrogens, other hormones, medicines, foods, dyes, or preservatives -pregnant or trying to get pregnant -breast-feeding How should I use this medicine? This medicine is only for use in the vagina. Do not take by mouth. Wash and dry your hands before and after use. Read package directions carefully. Unwrap the applicator package. Be sure to use a new applicator for each dose. Use at the  same time each day. If the tablet has fallen out of the applicator, but is still in the package, carefully place it back into the applicator. If the tablet has fallen out of the  package, that applicator should be thrown out and you should use a new applicator containing a new tablet. Lie on your back, part and bend your knees. Gently insert the applicator as far as comfortably possible into the vagina. Then, gently press the plunger until the plunger is fully depressed. This will release the tablet into the vagina. Gently remove the applicator. Throw away the applicator after use. Do not use your medicine more often than directed. Do not stop using except on the advice of your doctor or health care professional. Talk to your pediatrician regarding the use of this medicine in children. This medicine is not approved for use in children. A patient package insert for the product will be given with each prescription and refill. Read this sheet carefully each time. The sheet may change frequently. Overdosage: If you think you have taken too much of this medicine contact a poison control center or emergency room at once. NOTE: This medicine is only for you. Do not share this medicine with others. What if I miss a dose? If you miss a dose, take it as soon as you can. If it is almost time for your next dose, take only that dose. Do not take double or extra doses. What may interact with this medicine? Do not take this medicine with any of the following medications: -aromatase inhibitors like aminoglutethimide, anastrozole, exemestane, letrozole, testolactone This medicine may also interact with the following medications: -antibiotics used to treat tuberculosis like rifabutin, rifampin and rifapentene -raloxifene or tamoxifen -warfarin This list may not describe all possible interactions. Give your health care provider a list of all the medicines, herbs, non-prescription drugs, or dietary supplements you use. Also tell them if you smoke, drink alcohol, or use illegal drugs. Some items may interact with your medicine. What should I watch for while using this medicine? Visit your health  care professional for regular checks on your progress. You will need a regular breast and pelvic exam. You should also discuss the need for regular mammograms with your health care professional, and follow his or her guidelines. This medicine can make your body retain fluid, making your fingers, hands, or ankles swell. Your blood pressure can go up. Contact your doctor or health care professional if you feel you are retaining fluid. If you have any reason to think you are pregnant; stop taking this medicine at once and contact your doctor or health care professional. Tobacco smoking increases the risk of getting a blood clot or having a stroke, especially if you are more than 62 years old. You are strongly advised not to smoke. If you wear contact lenses and notice visual changes, or if the lenses begin to feel uncomfortable, consult your eye care specialist. If you are going to have elective surgery, you may need to stop taking this medicine beforehand. Consult your health care professional for advice prior to scheduling the surgery. What side effects may I notice from receiving this medicine? Side effects that you should report to your doctor or health care professional as soon as possible: -allergic reactions like skin rash, itching or hives, swelling of the face, lips, or tongue -breast tissue changes or discharge -changes in vision -chest pain -confusion, trouble speaking or understanding -dark urine -general ill feeling or flu-like symptoms -light-colored stools -nausea,  vomiting -pain, swelling, warmth in the leg -right upper belly pain -severe headaches -shortness of breath -sudden numbness or weakness of the face, arm or leg -trouble walking, dizziness, loss of balance or coordination -unusual vaginal bleeding -yellowing of the eyes or skin Side effects that usually do not require medical attention (report to your doctor or health care professional if they continue or are  bothersome): -hair loss -increased hunger or thirst -increased urination -symptoms of vaginal infection like itching, irritation or unusual discharge -unusually weak or tired This list may not describe all possible side effects. Call your doctor for medical advice about side effects. You may report side effects to FDA at 1-800-FDA-1088. Where should I keep my medicine? Keep out of the reach of children. Store at room temperature between 15 and 30 degrees C (59 and 86 degrees F). Throw away any unused medicine after the expiration date. NOTE: This sheet is a summary. It may not cover all possible information. If you have questions about this medicine, talk to your doctor, pharmacist, or health care provider.  2018 Elsevier/Gold Standard (2014-05-18 09:22:51)  Bacterial Vaginosis Bacterial vaginosis is a vaginal infection that occurs when the normal balance of bacteria in the vagina is disrupted. It results from an overgrowth of certain bacteria. This is the most common vaginal infection among women ages 36-44. Because bacterial vaginosis increases your risk for STIs (sexually transmitted infections), getting treated can help reduce your risk for chlamydia, gonorrhea, herpes, and HIV (human immunodeficiency virus). Treatment is also important for preventing complications in pregnant women, because this condition can cause an early (premature) delivery. What are the causes? This condition is caused by an increase in harmful bacteria that are normally present in small amounts in the vagina. However, the reason that the condition develops is not fully understood. What increases the risk? The following factors may make you more likely to develop this condition:  Having a new sexual partner or multiple sexual partners.  Having unprotected sex.  Douching.  Having an intrauterine device (IUD).  Smoking.  Drug and alcohol abuse.  Taking certain antibiotic medicines.  Being pregnant. You  cannot get bacterial vaginosis from toilet seats, bedding, swimming pools, or contact with objects around you. What are the signs or symptoms? Symptoms of this condition include:  Grey or white vaginal discharge. The discharge can also be watery or foamy.  A fish-like odor with discharge, especially after sexual intercourse or during menstruation.  Itching in and around the vagina.  Burning or pain with urination. Some women with bacterial vaginosis have no signs or symptoms. How is this diagnosed? This condition is diagnosed based on:  Your medical history.  A physical exam of the vagina.  Testing a sample of vaginal fluid under a microscope to look for a large amount of bad bacteria or abnormal cells. Your health care provider may use a cotton swab or a small wooden spatula to collect the sample. How is this treated? This condition is treated with antibiotics. These may be given as a pill, a vaginal cream, or a medicine that is put into the vagina (suppository). If the condition comes back after treatment, a second round of antibiotics may be needed. Follow these instructions at home: Medicines   Take over-the-counter and prescription medicines only as told by your health care provider.  Take or use your antibiotic as told by your health care provider. Do not stop taking or using the antibiotic even if you start to feel better. General instructions  If you have a female sexual partner, tell her that you have a vaginal infection. She should see her health care provider and be treated if she has symptoms. If you have a female sexual partner, he does not need treatment.  During treatment:  Avoid sexual activity until you finish treatment.  Do not douche.  Avoid alcohol as directed by your health care provider.  Avoid breastfeeding as directed by your health care provider.  Drink enough water and fluids to keep your urine clear or pale yellow.  Keep the area around your  vagina and rectum clean.  Wash the area daily with warm water.  Wipe yourself from front to back after using the toilet.  Keep all follow-up visits as told by your health care provider. This is important. How is this prevented?  Do not douche.  Wash the outside of your vagina with warm water only.  Use protection when having sex. This includes latex condoms and dental dams.  Limit how many sexual partners you have. To help prevent bacterial vaginosis, it is best to have sex with just one partner (monogamous).  Make sure you and your sexual partner are tested for STIs.  Wear cotton or cotton-lined underwear.  Avoid wearing tight pants and pantyhose, especially during summer.  Limit the amount of alcohol that you drink.  Do not use any products that contain nicotine or tobacco, such as cigarettes and e-cigarettes. If you need help quitting, ask your health care provider.  Do not use illegal drugs. Where to find more information:  Centers for Disease Control and Prevention: SolutionApps.co.za  American Sexual Health Association (ASHA): www.ashastd.org  [place] Department of Health and Health and safety inspector, Office on Women's Health: ConventionalMedicines.si or http://www.anderson-williamson.info/ Contact a health care provider if:  Your symptoms do not improve, even after treatment.  You have more discharge or pain when urinating.  You have a fever.  You have pain in your abdomen.  You have pain during sex.  You have vaginal bleeding between periods. Summary  Bacterial vaginosis is a vaginal infection that occurs when the normal balance of bacteria in the vagina is disrupted.  Because bacterial vaginosis increases your risk for STIs (sexually transmitted infections), getting treated can help reduce your risk for chlamydia, gonorrhea, herpes, and HIV (human immunodeficiency virus). Treatment is also important for preventing complications in pregnant women,  because the condition can cause an early (premature) delivery.  This condition is treated with antibiotic medicines. These may be given as a pill, a vaginal cream, or a medicine that is put into the vagina (suppository). This information is not intended to replace advice given to you by your health care provider. Make sure you discuss any questions you have with your health care provider. Document Released: 06/03/2005 Document Revised: 02/17/2016 Document Reviewed: 02/17/2016 Elsevier Interactive Patient Education  2017 Elsevier Inc.  Tinidazole tablets What is this medicine? TINIDAZOLE (tye NI da zole) is an antiinfective. It is used to treat amebiasis, giardiasis, trichomoniasis, and vaginosis. It will not work for colds, flu, or other viral infections. This medicine may be used for other purposes; ask your health care provider or pharmacist if you have questions. COMMON BRAND NAME(S): Tindamax What should I tell my health care provider before I take this medicine? They need to know if you have any of these conditions: -anemia or other blood disorders -if you frequently drink alcohol containing drinks -receiving hemodialysis -seizure disorder -an unusual or allergic reaction to tinidazole, other medicines, foods, dyes, or preservatives -pregnant  or trying to get pregnant -breast-feeding How should I use this medicine? Take this medicine by mouth with a full glass of water. Follow the directions on the prescription label. Take with food. Take your medicine at regular intervals. Do not take your medicine more often than directed. Take all of your medicine as directed even if you think you are better. Do not skip doses or stop your medicine early. Talk to your pediatrician regarding the use of this medicine in children. While this drug may be prescribed for children as young as 80 years of age for selected conditions, precautions do apply. Overdosage: If you think you have taken too much of  this medicine contact a poison control center or emergency room at once. NOTE: This medicine is only for you. Do not share this medicine with others. What if I miss a dose? If you miss a dose, take it as soon as you can. If it is almost time for your next dose, take only that dose. Do not take double or extra doses. What may interact with this medicine? Do not take this medicine with any of the following medications: -alcohol or any product that contains alcohol -amprenavir oral solution -disulfiram -paclitaxel injection -ritonavir oral solution -sertraline oral solution -sulfamethoxazole-trimethoprim injection This medicine may also interact with the following medications: -cholestyramine -cimetidine -conivaptan -cyclosporin -fluorouracil -fosphenytoin, phenytoin -ketoconazole -lithium -phenobarbital -tacrolimus -warfarin This list may not describe all possible interactions. Give your health care provider a list of all the medicines, herbs, non-prescription drugs, or dietary supplements you use. Also tell them if you smoke, drink alcohol, or use illegal drugs. Some items may interact with your medicine. What should I watch for while using this medicine? Tell your doctor or health care professional if your symptoms do not improve or if they get worse. Avoid alcoholic drinks while you are taking this medicine and for three days afterward. Alcohol may make you feel dizzy, sick, or flushed. If you are being treated for a sexually transmitted disease, avoid sexual contact until you have finished your treatment. Your sexual partner may also need treatment. What side effects may I notice from receiving this medicine? Side effects that you should report to your doctor or health care professional as soon as possible: -allergic reactions like skin rash, itching or hives, swelling of the face, lips, or tongue -breathing problems -confusion, depression -dark or white patches in the  mouth -feeling faint or lightheaded, falls -fever, infection -numbness, tingling, pain or weakness in the hands or feet -pain when passing urine -seizures -unusually weak or tired -vaginal irritation or discharge -vomiting Side effects that usually do not require medical attention (report to your doctor or health care professional if they continue or are bothersome): -dark brown or reddish urine -diarrhea -headache -loss of appetite -metallic taste -nausea -stomach upset This list may not describe all possible side effects. Call your doctor for medical advice about side effects. You may report side effects to FDA at 1-800-FDA-1088. Where should I keep my medicine? Keep out of the reach of children. Store at room temperature between 15 and 30 degrees C (59 and 86 degrees F). Protect from light and moisture. Keep container tightly closed. Throw away any unused medicine after the expiration date. NOTE: This sheet is a summary. It may not cover all possible information. If you have questions about this medicine, talk to your doctor, pharmacist, or health care provider.  2018 Elsevier/Gold Standard (2008-02-29 15:22:28)

## 2016-09-24 NOTE — Progress Notes (Signed)
Janice Bright 12-26-1955 [id number]   History:    61 y.o.  for annual gyn exam who has not been seen the office in 2012. Her records indicated that many years ago for benign pathology she had a total abdominal hysterectomy and bilateral salpingo-oophorectomy. For a few years for hormone replacement therapy should been on Estratest but has been off of it and has no vasomotor symptoms on vaginal dryness and discomfort during intercourse. She had a normal colonoscopy in 2011 and she has not had her bone density study as of yet her PCP has been doing her blood work. She was complaining of a slight vaginal discharge with odor and she brought over-the-counter Monistat which she applied intravaginally for 3 days. Her flu vaccine is up-to-date. Patient prior to her hysterectomy has always had normal Pap smears.  Past medical history,surgical history, family history and social history were all reviewed and documented in the EPIC chart.  Gynecologic History No LMP recorded. Patient has had a hysterectomy. Contraception: status post hysterectomy Last Pap: 2012. Results were: normal Last mammogram: 2016. Results were: Normal but dense  Obstetric History OB History  Gravida Para Term Preterm AB Living  SAB TAB Ectopic Multiple Live Births        0      # Outcome Date GA Lbr Len/2nd Weight Sex Delivery Anes PTL Lv  3 Term 04/08/86     Vag-Spont     2 Term 03/04/82          1 Term 02/02/79     Vag-Spont          ROS: A ROS was performed and pertinent positives and negatives are included in the history.  GENERAL: No fevers or chills. HEENT: No change in vision, no earache, sore throat or sinus congestion. NECK: No pain or stiffness. CARDIOVASCULAR: No chest pain or pressure. No palpitations. PULMONARY: No shortness of breath, cough or wheeze. GASTROINTESTINAL: No abdominal pain, nausea, vomiting or diarrhea, melena or bright red blood per rectum. GENITOURINARY: No urinary frequency,  urgency, hesitancy or dysuria. MUSCULOSKELETAL: No joint or muscle pain, no back pain, no recent trauma. DERMATOLOGIC: No rash, no itching, no lesions. ENDOCRINE: No polyuria, polydipsia, no heat or cold intolerance. No recent change in weight. HEMATOLOGICAL: No anemia or easy bruising or bleeding. NEUROLOGIC: No headache, seizures, numbness, tingling or weakness. PSYCHIATRIC: No depression, no loss of interest in normal activity or change in sleep pattern.     Exam: chaperone present  BP 128/82   Ht  (1.626 m)   Wt 245 lb 3.2 oz (111.2 kg)   BMI 42.09 kg/m   Body mass index is 42.09 kg/m.  General appearance : Well developed well nourished female. No acute distress HEENT: Eyes: no retinal hemorrhage or exudates,  Neck supple, trachea midline, no carotid bruits, no thyroidmegaly Lungs: Clear to auscultation, no rhonchi or wheezes, or rib retractions  Heart: Regular rate and rhythm, no murmurs or gallops Breast:Examined in sitting and supine position were symmetrical in appearance, no palpable masses or tenderness,  no skin retraction, no nipple inversion, no nipple discharge, no skin discoloration, no axillary or supraclavicular lymphadenopathy Abdomen: no palpable masses or tenderness, no rebound or guarding Extremities: no edema or skin discoloration or tenderness  Pelvic:  Bartholin, Urethra, Skene Glands: Within normal limits             Vagina: No gross lesions creamy discharge  Cervix: Absent  Uterus  absent  Adnexa  Without masses or tenderness  Anus and perineum  normal   Rectovaginal  normal sphincter tone without palpated masses or tenderness             Hemoccult cards provided   Wet prep: Moderate bacteria few white blood cells  Assessment/Plan:  61 y.o. female for annual exam because of patient's vaginal atrophy she will be prescribed Vagifem 10 g to apply intravaginally twice a week. Risk benefits and pros and cons were discussed. Her discharge she'll be  prescribed Tindamax 500 mg 4 tablets today for talus tomorrow. She was provided with fecal Hemoccult cards to submit to the office for testing. Patient to schedule her bone density study here in the office the next few weeks. She was given a requisition to schedule her overdue mammogram to request a three-dimensional mammogram as of her dense breasts. Patient to follow-up with her gastroenterologist. Pap smear not indicated.   An additional 15 minutes was spent discussing menopause, vaginal atrophy, hormone replacement therapy as well as bacterial vaginosis and recommended screening test  Ok Edwards MD, 11:35 AM 09/24/2016

## 2016-10-15 ENCOUNTER — Other Ambulatory Visit: Payer: Self-pay | Admitting: Gynecology

## 2016-10-15 DIAGNOSIS — Z13820 Encounter for screening for osteoporosis: Secondary | ICD-10-CM

## 2016-10-15 DIAGNOSIS — Z780 Asymptomatic menopausal state: Secondary | ICD-10-CM

## 2016-10-28 ENCOUNTER — Other Ambulatory Visit: Payer: Self-pay | Admitting: Gynecology

## 2016-10-28 ENCOUNTER — Ambulatory Visit (INDEPENDENT_AMBULATORY_CARE_PROVIDER_SITE_OTHER): Payer: BLUE CROSS/BLUE SHIELD

## 2016-10-28 DIAGNOSIS — M8588 Other specified disorders of bone density and structure, other site: Secondary | ICD-10-CM

## 2016-10-28 DIAGNOSIS — Z780 Asymptomatic menopausal state: Secondary | ICD-10-CM

## 2016-10-28 DIAGNOSIS — Z13820 Encounter for screening for osteoporosis: Secondary | ICD-10-CM | POA: Diagnosis not present

## 2016-10-30 ENCOUNTER — Encounter: Payer: Self-pay | Admitting: Gynecology

## 2017-02-27 ENCOUNTER — Encounter: Payer: Self-pay | Admitting: Family Medicine

## 2017-02-27 ENCOUNTER — Ambulatory Visit (INDEPENDENT_AMBULATORY_CARE_PROVIDER_SITE_OTHER): Payer: BLUE CROSS/BLUE SHIELD | Admitting: Family Medicine

## 2017-02-27 VITALS — BP 130/88 | Temp 98.5°F | Ht 64.0 in | Wt 252.0 lb

## 2017-02-27 DIAGNOSIS — Z0001 Encounter for general adult medical examination with abnormal findings: Secondary | ICD-10-CM | POA: Diagnosis not present

## 2017-02-27 DIAGNOSIS — K589 Irritable bowel syndrome without diarrhea: Secondary | ICD-10-CM | POA: Insufficient documentation

## 2017-02-27 DIAGNOSIS — K582 Mixed irritable bowel syndrome: Secondary | ICD-10-CM

## 2017-02-27 DIAGNOSIS — R739 Hyperglycemia, unspecified: Secondary | ICD-10-CM

## 2017-02-27 DIAGNOSIS — Z0000 Encounter for general adult medical examination without abnormal findings: Secondary | ICD-10-CM

## 2017-02-27 DIAGNOSIS — N3941 Urge incontinence: Secondary | ICD-10-CM

## 2017-02-27 LAB — LIPID PANEL
CHOL/HDL RATIO: 4
Cholesterol: 192 mg/dL (ref 0–200)
HDL: 49.2 mg/dL (ref >39.00)
LDL Cholesterol: 118 mg/dL — ABNORMAL HIGH (ref 0–99)
NONHDL: 142.91
Triglycerides: 127 mg/dL (ref 0.0–149.0)
VLDL: 25.4 mg/dL (ref 0.0–40.0)

## 2017-02-27 LAB — CBC WITH DIFFERENTIAL/PLATELET
Basophils Absolute: 0 10*3/uL (ref 0.0–0.1)
Basophils Relative: 0.4 % (ref 0.0–3.0)
Eosinophils Absolute: 0.2 10*3/uL (ref 0.0–0.7)
Eosinophils Relative: 3.4 % (ref 0.0–5.0)
HEMATOCRIT: 41 % (ref 36.0–46.0)
HEMOGLOBIN: 13.1 g/dL (ref 12.0–15.0)
LYMPHS ABS: 1.4 10*3/uL (ref 0.7–4.0)
Lymphocytes Relative: 22.6 % (ref 12.0–46.0)
MCHC: 31.9 g/dL (ref 30.0–36.0)
MCV: 87.3 fl (ref 78.0–100.0)
MONOS PCT: 8.8 % (ref 3.0–12.0)
Monocytes Absolute: 0.5 10*3/uL (ref 0.1–1.0)
Neutro Abs: 4 10*3/uL (ref 1.4–7.7)
Neutrophils Relative %: 64.8 % (ref 43.0–77.0)
Platelets: 252 10*3/uL (ref 150.0–400.0)
RBC: 4.69 Mil/uL (ref 3.87–5.11)
RDW: 14 % (ref 11.5–15.5)
WBC: 6.2 10*3/uL (ref 4.0–10.5)

## 2017-02-27 LAB — POC URINALSYSI DIPSTICK (AUTOMATED)
Blood, UA: NEGATIVE
Glucose, UA: NEGATIVE
KETONES UA: NEGATIVE
LEUKOCYTES UA: NEGATIVE
Nitrite, UA: NEGATIVE
PROTEIN UA: NEGATIVE
Spec Grav, UA: >=1.03 — AB (ref 1.010–1.025)
Urobilinogen, UA: 0.2 E.U./dL
pH, UA: 6 (ref 5.0–8.0)

## 2017-02-27 LAB — HEPATIC FUNCTION PANEL
ALT: 17 U/L (ref 0–35)
AST: 25 U/L (ref 0–37)
Albumin: 4.3 g/dL (ref 3.5–5.2)
Alkaline Phosphatase: 113 U/L (ref 39–117)
BILIRUBIN DIRECT: 0.1 mg/dL (ref 0.0–0.3)
BILIRUBIN TOTAL: 0.5 mg/dL (ref 0.2–1.2)
Total Protein: 7.1 g/dL (ref 6.0–8.3)

## 2017-02-27 LAB — BASIC METABOLIC PANEL
BUN: 29 mg/dL — ABNORMAL HIGH (ref 6–23)
CHLORIDE: 110 meq/L (ref 96–112)
CO2: 27 mEq/L (ref 19–32)
CREATININE: 1.03 mg/dL (ref 0.40–1.20)
Calcium: 9.6 mg/dL (ref 8.4–10.5)
GFR: 69.73 mL/min (ref >60.00)
Glucose, Bld: 101 mg/dL — ABNORMAL HIGH (ref 70–99)
Potassium: 4.3 mEq/L (ref 3.5–5.1)
SODIUM: 145 meq/L (ref 135–145)

## 2017-02-27 LAB — TSH: TSH: 1.59 u[IU]/mL (ref 0.35–4.50)

## 2017-02-27 LAB — HEMOGLOBIN A1C: Hgb A1c MFr Bld: 6 % (ref 4.6–6.5)

## 2017-02-27 MED ORDER — TOLTERODINE TARTRATE ER 2 MG PO CP24: 2.0000 mg | ORAL_CAPSULE | Freq: Every day | ORAL | 3 refills | Status: DC

## 2017-02-27 MED ORDER — ESCITALOPRAM OXALATE 20 MG PO TABS: 20.0000 mg | ORAL_TABLET | Freq: Every day | ORAL | 3 refills | Status: DC

## 2017-02-27 NOTE — Patient Instructions (Signed)
WE NOW OFFER   [facility] FAST TRACK!!!  SAME DAY Appointments for ACUTE CARE  Such as: Sprains, Injuries, cuts, abrasions, rashes, muscle pain, joint pain, back pain Colds, flu, sore throats, headache, allergies, cough, fever  Ear pain, sinus and eye infections Abdominal pain, nausea, vomiting, diarrhea, upset stomach Animal/insect bites  3 Easy Ways to Schedule: Walk-In Scheduling Call in scheduling Mychart Sign-up: [email or web address]         

## 2017-02-27 NOTE — Progress Notes (Signed)
   Subjective:    Patient ID: Janice Ridgelheresa D Bright, female    DOB: 06/27/1955, 61 y.o.   MRN: [id number]  HPI Here for a well exam. She has a few concerns. First her depression has been acting up and she has increased her Lexapro to 2 tabs a day (for a total of 20 mg). This has helped a lot. Also she describes her stools as alternating between being hard and loose. She drinks plenty pf water and gets fiber in her diet. Also she describes frequent urgency to urinate and she sometimes can't make it to the bathroom before she leaks some urine. No burning. She has decided to join her daughter in getting on a ketogenic diet.    Review of Systems  Constitutional: Negative.   HENT: Negative.   Eyes: Negative.   Respiratory: Negative.   Cardiovascular: Negative.   Gastrointestinal: Positive for constipation and diarrhea. Negative for abdominal distention, abdominal pain, anal bleeding, blood in stool, nausea, rectal pain and vomiting.  Genitourinary: Positive for urgency. Negative for decreased urine volume, difficulty urinating, dyspareunia, dysuria, enuresis, flank pain, frequency, hematuria, pelvic pain and vaginal discharge.  Musculoskeletal: Negative.   Skin: Negative.   Neurological: Negative.   Psychiatric/Behavioral: Negative.        Objective:   Physical Exam  Constitutional: She is oriented to person, place, and time. She appears well-developed and well-nourished. No distress.  HENT:  Head: Normocephalic and atraumatic.  Right Ear: External ear normal.  Left Ear: External ear normal.  Nose: Nose normal.  Mouth/Throat: Oropharynx is clear and moist. No oropharyngeal exudate.  Eyes: Pupils are equal, round, and reactive to light. Conjunctivae and EOM are normal. No scleral icterus.  Neck: Normal range of motion. Neck supple. No JVD present. No thyromegaly present.  Cardiovascular: Normal rate, regular rhythm, normal heart sounds and intact distal pulses.  Exam reveals no gallop and no  friction rub.   No murmur heard. Pulmonary/Chest: Effort normal and breath sounds normal. No respiratory distress. She has no wheezes. She has no rales. She exhibits no tenderness.  Abdominal: Soft. Bowel sounds are normal. She exhibits no distension and no mass. There is no tenderness. There is no rebound and no guarding.  Musculoskeletal: Normal range of motion. She exhibits no edema or tenderness.  Lymphadenopathy:    She has no cervical adenopathy.  Neurological: She is alert and oriented to person, place, and time. She has normal reflexes. No cranial nerve deficit. She exhibits normal muscle tone. Coordination normal.  Skin: Skin is warm and dry. No rash noted. No erythema.  Psychiatric: She has a normal mood and affect. Her behavior is normal. Judgment and thought content normal.          Assessment & Plan:  Well exam. We discussed diet and exercise. She will try the "keto" diet for a few months. For her depression she will stay on the 20 mg daily dose of Lexapro. She has some IBS and I suggested she use Miralax daily. She has some urge incontinence, and she will try Detrol LA 2 mg daily.  Gershon CraneStephen Shambhavi Salley, MD

## 2017-03-05 ENCOUNTER — Telehealth: Payer: Self-pay | Admitting: Family Medicine

## 2017-03-05 NOTE — Telephone Encounter (Signed)
Pt is return Janice Bright

## 2017-03-07 NOTE — Telephone Encounter (Signed)
I left a voice message for pt, I printed a copy of results and put in the mail on 03/03/2017.

## 2017-04-01 DIAGNOSIS — H02886 Meibomian gland dysfunction of left eye, unspecified eyelid: Secondary | ICD-10-CM | POA: Diagnosis not present

## 2017-04-01 DIAGNOSIS — H02883 Meibomian gland dysfunction of right eye, unspecified eyelid: Secondary | ICD-10-CM | POA: Diagnosis not present

## 2017-04-01 DIAGNOSIS — H04123 Dry eye syndrome of bilateral lacrimal glands: Secondary | ICD-10-CM | POA: Diagnosis not present

## 2017-04-17 DIAGNOSIS — M79644 Pain in right finger(s): Secondary | ICD-10-CM | POA: Diagnosis not present

## 2017-04-17 DIAGNOSIS — M65311 Trigger thumb, right thumb: Secondary | ICD-10-CM | POA: Diagnosis not present

## 2017-06-02 ENCOUNTER — Ambulatory Visit
Admission: RE | Admit: 2017-06-02 | Discharge: 2017-06-02 | Disposition: A | Discharge status: Home / Self Care | Payer: BLUE CROSS/BLUE SHIELD | Source: Ambulatory Visit | Attending: Family Medicine | Admitting: Family Medicine

## 2017-06-02 ENCOUNTER — Other Ambulatory Visit: Payer: Self-pay | Admitting: Family Medicine

## 2017-06-02 DIAGNOSIS — M79671 Pain in right foot: Secondary | ICD-10-CM

## 2017-06-02 DIAGNOSIS — M79672 Pain in left foot: Secondary | ICD-10-CM | POA: Diagnosis not present

## 2017-06-02 DIAGNOSIS — M7989 Other specified soft tissue disorders: Secondary | ICD-10-CM | POA: Diagnosis not present

## 2017-10-14 ENCOUNTER — Other Ambulatory Visit: Payer: Self-pay | Admitting: Family Medicine

## 2017-11-02 ENCOUNTER — Other Ambulatory Visit: Payer: Self-pay | Admitting: Family Medicine

## 2017-11-03 ENCOUNTER — Ambulatory Visit: Payer: BLUE CROSS/BLUE SHIELD | Admitting: Family Medicine

## 2017-11-03 ENCOUNTER — Encounter: Payer: Self-pay | Admitting: Family Medicine

## 2017-11-03 VITALS — BP 130/84 | HR 82 | Temp 98.8°F | Wt 237.0 lb

## 2017-11-03 DIAGNOSIS — M546 Pain in thoracic spine: Secondary | ICD-10-CM | POA: Diagnosis not present

## 2017-11-03 MED ORDER — TOLTERODINE TARTRATE ER 2 MG PO CP24: 2.0000 mg | ORAL_CAPSULE | Freq: Every day | ORAL | 3 refills | Status: DC

## 2017-11-03 MED ORDER — DICLOFENAC SODIUM 75 MG PO TBEC: 75.0000 mg | DELAYED_RELEASE_TABLET | Freq: Two times a day (BID) | ORAL | 2 refills | Status: DC

## 2017-11-03 NOTE — Progress Notes (Signed)
   Subjective:    Patient ID: Janice Bright, female    DOB: Sep 07, 1955, 62 y.o.   MRN: [id number]  HPI Here for 4 weeks of intermittent sharp pains in the right middle back and right flank. No hx of trauma but at te time this started she was moving some heavy furniture. No change in BMs or urinations. It hurts when she twists her body certain ways.    Review of Systems  Constitutional: Negative.   Respiratory: Negative.   Cardiovascular: Negative.   Gastrointestinal: Negative.   Musculoskeletal: Positive for back pain.       Objective:   Physical Exam  Constitutional: She appears well-developed and well-nourished.  Cardiovascular: Normal rate, regular rhythm, normal heart sounds and intact distal pulses.  Pulmonary/Chest: Effort normal and breath sounds normal. No stridor. No respiratory distress. She has no wheezes. She has no rales.  Abdominal: Soft. Bowel sounds are normal. She exhibits no distension and no mass. There is no tenderness. There is no rebound and no guarding. No hernia.  Musculoskeletal:  Tender in the the lower right back around the lower thoracic region. Not tender over the spine itself. Full ROM           Assessment & Plan:  Thoracic pain, likely a muscle strain or a pinched nerve. Use heat and Diclofenac. Get Xrays of the thoracic spine.  Gershon Crane, MD

## 2017-11-04 ENCOUNTER — Ambulatory Visit (INDEPENDENT_AMBULATORY_CARE_PROVIDER_SITE_OTHER)
Admission: RE | Admit: 2017-11-04 | Discharge: 2017-11-04 | Disposition: A | Discharge status: Home / Self Care | Payer: BLUE CROSS/BLUE SHIELD | Source: Ambulatory Visit | Attending: Family Medicine | Admitting: Family Medicine

## 2017-11-04 DIAGNOSIS — M546 Pain in thoracic spine: Secondary | ICD-10-CM

## 2017-12-10 DIAGNOSIS — Z1231 Encounter for screening mammogram for malignant neoplasm of breast: Secondary | ICD-10-CM | POA: Diagnosis not present

## 2017-12-15 ENCOUNTER — Encounter: Payer: Self-pay | Admitting: Family Medicine

## 2017-12-15 DIAGNOSIS — R922 Inconclusive mammogram: Secondary | ICD-10-CM | POA: Diagnosis not present

## 2017-12-15 DIAGNOSIS — R921 Mammographic calcification found on diagnostic imaging of breast: Secondary | ICD-10-CM | POA: Diagnosis not present

## 2017-12-15 LAB — HM MAMMOGRAPHY

## 2018-04-24 ENCOUNTER — Ambulatory Visit (INDEPENDENT_AMBULATORY_CARE_PROVIDER_SITE_OTHER): Payer: BLUE CROSS/BLUE SHIELD | Admitting: Family Medicine

## 2018-04-24 ENCOUNTER — Encounter: Payer: Self-pay | Admitting: Family Medicine

## 2018-04-24 VITALS — BP 126/82 | HR 75 | Temp 98.0°F | Ht 64.0 in | Wt 250.4 lb

## 2018-04-24 DIAGNOSIS — Z0000 Encounter for general adult medical examination without abnormal findings: Secondary | ICD-10-CM

## 2018-04-24 LAB — POC URINALSYSI DIPSTICK (AUTOMATED)
BILIRUBIN UA: NEGATIVE
Glucose, UA: NEGATIVE
KETONES UA: NEGATIVE
LEUKOCYTES UA: NEGATIVE
NITRITE UA: NEGATIVE
PROTEIN UA: NEGATIVE
RBC UA: NEGATIVE
Spec Grav, UA: >=1.03 — AB (ref 1.010–1.025)
Urobilinogen, UA: 0.2 E.U./dL
pH, UA: 5 (ref 5.0–8.0)

## 2018-04-24 MED ORDER — LOSARTAN POTASSIUM-HCTZ 100-25 MG PO TABS: 1.0000 | ORAL_TABLET | Freq: Every day | ORAL | 3 refills | Status: DC

## 2018-04-24 MED ORDER — ESCITALOPRAM OXALATE 20 MG PO TABS: 20.0000 mg | ORAL_TABLET | Freq: Every day | ORAL | 3 refills | Status: DC

## 2018-04-24 MED ORDER — AMLODIPINE BESYLATE 5 MG PO TABS: 5.0000 mg | ORAL_TABLET | Freq: Every day | ORAL | 3 refills | Status: DC

## 2018-04-24 NOTE — Progress Notes (Signed)
   Subjective:    Patient ID: Janice Bright, female    DOB: September 22, 1955, 62 y.o.   MRN: [id number]  HPI Here for a well exam. She is doing well.    Review of Systems  Constitutional: Negative.   HENT: Negative.   Eyes: Negative.   Respiratory: Negative.   Cardiovascular: Negative.   Gastrointestinal: Negative.   Genitourinary: Negative for decreased urine volume, difficulty urinating, dyspareunia, dysuria, enuresis, flank pain, frequency, hematuria, pelvic pain and urgency.  Musculoskeletal: Negative.   Skin: Negative.   Neurological: Negative.   Psychiatric/Behavioral: Negative.        Objective:   Physical Exam  Constitutional: She is oriented to person, place, and time. She appears well-developed and well-nourished. No distress.  HENT:  Head: Normocephalic and atraumatic.  Right Ear: External ear normal.  Left Ear: External ear normal.  Nose: Nose normal.  Mouth/Throat: Oropharynx is clear and moist. No oropharyngeal exudate.  Eyes: Pupils are equal, round, and reactive to light. Conjunctivae and EOM are normal. No scleral icterus.  Neck: Normal range of motion. Neck supple. No JVD present. No thyromegaly present.  Cardiovascular: Normal rate, regular rhythm, normal heart sounds and intact distal pulses. Exam reveals no gallop and no friction rub.  No murmur heard. Pulmonary/Chest: Effort normal and breath sounds normal. No respiratory distress. She has no wheezes. She has no rales. She exhibits no tenderness.  Abdominal: Soft. Bowel sounds are normal. She exhibits no distension and no mass. There is no tenderness. There is no rebound and no guarding.  Musculoskeletal: Normal range of motion. She exhibits no edema or tenderness.  Lymphadenopathy:    She has no cervical adenopathy.  Neurological: She is alert and oriented to person, place, and time. She has normal reflexes. She displays normal reflexes. No cranial nerve deficit. She exhibits normal muscle tone. Coordination  normal.  Skin: Skin is warm and dry. No rash noted. No erythema.  Psychiatric: She has a normal mood and affect. Her behavior is normal. Judgment and thought content normal.          Assessment & Plan:  Well exam. We discussed diet and exercise. Get fasting labs.  Gershon Crane, MD

## 2018-04-25 LAB — HEPATIC FUNCTION PANEL
AG Ratio: 1.6 (calc) (ref 1.0–2.5)
ALBUMIN MSPROF: 4.4 g/dL (ref 3.6–5.1)
ALT: 20 U/L (ref 6–29)
AST: 20 U/L (ref 10–35)
Alkaline phosphatase (APISO): 94 U/L (ref 33–130)
Bilirubin, Direct: 0.1 mg/dL (ref 0.0–0.2)
GLOBULIN: 2.8 g/dL (ref 1.9–3.7)
Indirect Bilirubin: 0.4 mg/dL (calc) (ref 0.2–1.2)
TOTAL PROTEIN: 7.2 g/dL (ref 6.1–8.1)
Total Bilirubin: 0.5 mg/dL (ref 0.2–1.2)

## 2018-04-25 LAB — LIPID PANEL
CHOL/HDL RATIO: 4.2 (calc) (ref <5.0)
CHOLESTEROL: 212 mg/dL — AB (ref <200)
HDL: 50 mg/dL — AB (ref >50)
LDL Cholesterol (Calc): 136 mg/dL (calc) — ABNORMAL HIGH
Non-HDL Cholesterol (Calc): 162 mg/dL (calc) — ABNORMAL HIGH (ref <130)
TRIGLYCERIDES: 136 mg/dL (ref <150)

## 2018-04-25 LAB — CBC WITH DIFFERENTIAL/PLATELET
BASOS PCT: 0.5 %
Basophils Absolute: 31 cells/uL (ref 0–200)
EOS ABS: 207 {cells}/uL (ref 15–500)
Eosinophils Relative: 3.4 %
HCT: 40.5 % (ref 35.0–45.0)
HEMOGLOBIN: 13.7 g/dL (ref 11.7–15.5)
Lymphs Abs: 1848 cells/uL (ref 850–3900)
MCH: 27.8 pg (ref 27.0–33.0)
MCHC: 33.8 g/dL (ref 32.0–36.0)
MCV: 82.3 fL (ref 80.0–100.0)
MPV: 9.5 fL (ref 7.5–12.5)
Monocytes Relative: 6.5 %
Neutro Abs: 3617 cells/uL (ref 1500–7800)
Neutrophils Relative %: 59.3 %
Platelets: 244 10*3/uL (ref 140–400)
RBC: 4.92 10*6/uL (ref 3.80–5.10)
RDW: 12.7 % (ref 11.0–15.0)
TOTAL LYMPHOCYTE: 30.3 %
WBC: 6.1 10*3/uL (ref 3.8–10.8)
WBCMIX: 397 {cells}/uL (ref 200–950)

## 2018-04-25 LAB — BASIC METABOLIC PANEL
BUN/Creatinine Ratio: 29 (calc) — ABNORMAL HIGH (ref 6–22)
BUN: 32 mg/dL — ABNORMAL HIGH (ref 7–25)
CALCIUM: 9.4 mg/dL (ref 8.6–10.4)
CO2: 26 mmol/L (ref 20–32)
Chloride: 105 mmol/L (ref 98–110)
Creat: 1.11 mg/dL — ABNORMAL HIGH (ref 0.50–0.99)
GLUCOSE: 88 mg/dL (ref 65–99)
Potassium: 3.7 mmol/L (ref 3.5–5.3)
Sodium: 142 mmol/L (ref 135–146)

## 2018-04-25 LAB — TSH: TSH: 1.09 mIU/L (ref 0.40–4.50)

## 2018-04-29 ENCOUNTER — Encounter: Payer: Self-pay | Admitting: *Deleted

## 2018-06-23 DIAGNOSIS — R921 Mammographic calcification found on diagnostic imaging of breast: Secondary | ICD-10-CM | POA: Diagnosis not present

## 2018-06-29 ENCOUNTER — Encounter: Payer: Self-pay | Admitting: Family Medicine

## 2018-06-29 ENCOUNTER — Other Ambulatory Visit: Payer: Self-pay | Admitting: Radiology

## 2018-06-29 DIAGNOSIS — N6489 Other specified disorders of breast: Secondary | ICD-10-CM | POA: Diagnosis not present

## 2018-06-29 DIAGNOSIS — N6324 Unspecified lump in the left breast, lower inner quadrant: Secondary | ICD-10-CM | POA: Diagnosis not present

## 2018-06-29 DIAGNOSIS — R921 Mammographic calcification found on diagnostic imaging of breast: Secondary | ICD-10-CM | POA: Diagnosis not present

## 2018-09-14 ENCOUNTER — Other Ambulatory Visit: Payer: Self-pay | Admitting: Family Medicine

## 2018-09-14 MED ORDER — AMLODIPINE BESYLATE 5 MG PO TABS: 5.0000 mg | ORAL_TABLET | Freq: Every day | ORAL | 1 refills | Status: DC

## 2018-11-10 ENCOUNTER — Telehealth: Payer: Self-pay | Admitting: Family Medicine

## 2018-11-10 NOTE — Telephone Encounter (Signed)
Pt would like to have a in person visit for the following:  I'm having a problem with what seem like bone rubbing against bone on the right side of my face. This is causing pain on the right side of my face.  Is it okay to schedule the pt for an in office visit?

## 2018-11-11 ENCOUNTER — Other Ambulatory Visit: Payer: Self-pay

## 2018-11-11 ENCOUNTER — Ambulatory Visit: Payer: BLUE CROSS/BLUE SHIELD | Admitting: Family Medicine

## 2018-11-11 NOTE — Telephone Encounter (Signed)
Yes please set up an in person OV

## 2018-11-11 NOTE — Telephone Encounter (Signed)
Called pt to get her r/s'd for a in the office visit lmom for her to give the office a call back.

## 2018-11-12 NOTE — Telephone Encounter (Signed)
lmom for pt to call the office to get set-up for a in the office appt per Dr. Clent Ridges

## 2019-03-29 ENCOUNTER — Other Ambulatory Visit: Payer: Self-pay

## 2019-03-29 ENCOUNTER — Other Ambulatory Visit: Payer: Self-pay | Admitting: Internal Medicine

## 2019-03-29 ENCOUNTER — Ambulatory Visit
Admission: RE | Admit: 2019-03-29 | Discharge: 2019-03-29 | Disposition: A | Discharge status: Home / Self Care | Payer: BC Managed Care – PPO | Source: Ambulatory Visit | Attending: Internal Medicine | Admitting: Internal Medicine

## 2019-03-29 DIAGNOSIS — M25561 Pain in right knee: Secondary | ICD-10-CM | POA: Diagnosis not present

## 2019-05-11 ENCOUNTER — Encounter: Payer: BLUE CROSS/BLUE SHIELD | Admitting: Family Medicine

## 2019-10-06 ENCOUNTER — Other Ambulatory Visit: Payer: Self-pay | Admitting: Family Medicine

## 2019-11-03 ENCOUNTER — Telehealth: Payer: Self-pay | Admitting: *Deleted

## 2019-11-03 NOTE — Telephone Encounter (Signed)
Could you please contact her and see how she is doing? We can certainly see her today if needed

## 2019-11-03 NOTE — Telephone Encounter (Signed)
Left message for patient to call back  

## 2019-11-03 NOTE — Telephone Encounter (Signed)
Patient called after hours line 11/02/2019 at 5:17 PM. Patient reports she  would like to schedule a medical appointment. Caller stated that she is experiencing tingling on her face and arms.  States she has been out of her BP medicine for 4-5 days. States she takes the meds losartan, HCTZ and amlodipine.  Patient was advised to go to [facility] ED.   Clinic RN does not see where she went.

## 2019-11-05 ENCOUNTER — Telehealth: Payer: Self-pay | Admitting: Family Medicine

## 2019-11-05 ENCOUNTER — Ambulatory Visit: Payer: BC Managed Care – PPO | Admitting: Family Medicine

## 2019-11-05 ENCOUNTER — Other Ambulatory Visit: Payer: Self-pay

## 2019-11-05 MED ORDER — AMLODIPINE BESYLATE 5 MG PO TABS: 5.0000 mg | ORAL_TABLET | Freq: Every day | ORAL | 0 refills | Status: DC

## 2019-11-05 MED ORDER — LOSARTAN POTASSIUM-HCTZ 100-25 MG PO TABS: 1.0000 | ORAL_TABLET | Freq: Every day | ORAL | 0 refills | Status: DC

## 2019-11-05 MED ORDER — ESCITALOPRAM OXALATE 20 MG PO TABS: 20.0000 mg | ORAL_TABLET | Freq: Every day | ORAL | 0 refills | Status: DC

## 2019-11-05 MED ORDER — LORATADINE 10 MG PO TABS: 10.0000 mg | ORAL_TABLET | Freq: Every day | ORAL | 0 refills | Status: DC

## 2019-11-05 NOTE — Telephone Encounter (Signed)
Refill these for 30 days only, she will still need an OV for more

## 2019-11-05 NOTE — Telephone Encounter (Signed)
Rx sent in. Patient is aware.  

## 2019-11-05 NOTE — Telephone Encounter (Signed)
Patient has cough and sore throat so couldn't come in for her visit.  She needs refills on Amlodipine Besylate, Losartan HCTZ, loratadine, Escitalopram  Patient has been out of these medications for a few days.  Pharmacy- [facility]

## 2019-11-05 NOTE — Telephone Encounter (Signed)
Ok for refills? Pt needed an appointment for further refills but was not able to come in due to covid symptoms.

## 2019-11-09 NOTE — Telephone Encounter (Signed)
Left message for patient to call back  

## 2019-11-27 ENCOUNTER — Other Ambulatory Visit: Payer: Self-pay | Admitting: Family Medicine

## 2019-11-28 ENCOUNTER — Other Ambulatory Visit: Payer: Self-pay | Admitting: Family Medicine

## 2020-01-14 ENCOUNTER — Encounter: Payer: Self-pay | Admitting: Internal Medicine

## 2020-01-14 ENCOUNTER — Ambulatory Visit: Payer: BC Managed Care – PPO | Admitting: Internal Medicine

## 2020-01-14 ENCOUNTER — Other Ambulatory Visit: Payer: Self-pay

## 2020-01-14 VITALS — BP 108/68 | HR 77 | Temp 98.9°F | Ht 65.0 in | Wt 267.2 lb

## 2020-01-14 DIAGNOSIS — I10 Essential (primary) hypertension: Secondary | ICD-10-CM | POA: Diagnosis not present

## 2020-01-14 DIAGNOSIS — Z79899 Other long term (current) drug therapy: Secondary | ICD-10-CM

## 2020-01-14 DIAGNOSIS — R202 Paresthesia of skin: Secondary | ICD-10-CM

## 2020-01-14 DIAGNOSIS — D869 Sarcoidosis, unspecified: Secondary | ICD-10-CM

## 2020-01-14 NOTE — Progress Notes (Signed)
Chief Complaint  Patient presents with  . Tingling    all over body, gets worse in the afternoon, feels like little needles pricking her, going on for about 2 months, getting worse    HPI: Janice Bright 64 y.o. come in for problem SDA acute visit   PCP NA today .   On going sx getting worse and  It scares her and concern about bp control and sx  Feeling like needles all over body  different places at different times . Quick sharp like a needle prick  Seems to be more afternoon and  Hard to sleep with this  Works from home    But not sure why in upper  Extremities  ffeels like  A prickly needle  Keeps up  And causes fright .  bp   Wrist   Cuff at home  More visible   . Feeling  Hx of neck surgery c spine  But no obv weakness  Also get some burning sx   Feeling  Has hx of c spine surgery   Is not taking  Supplements no detrol   Now   But lexapro  Ok   ROS: See pertinent positives and negatives per HPI. No cp sob vision changes  Works from home [employer]   No tobacoo rd  etoh maybe one  Per evening No resp sx with sarcoid Past Medical History:  Diagnosis Date  . Anxiety   . Colitis    sees Dr. Arlyce Dice  . Compression, spinal, spondylogenic, cervical   . Depression   . Gynecological examination    sees Dr. Beatrix Shipper  . Hypertension   . Normal eye exam    sees Dr. Mitzi Davenport   . Normal spontaneous vaginal delivery    3  . Sarcoidosis    withlung involvement, sees Dr. Lovie Macadamia    Family History  Problem Relation Age of Onset  . Heart disease Mother   . Diabetes Father   . Hypertension Other   . Cancer Other        colon    Social History   Socioeconomic History  . Marital status: Married    Spouse name: Not on file  . Number of children: Not on file  . Years of education: Not on file  . Highest education level: Not on file  Occupational History  . Not on file  Tobacco Use  . Smoking status: Never Smoker  . Smokeless tobacco: Never Used  Vaping Use  .  Vaping Use: Never used  Substance and Sexual Activity  . Alcohol use: Yes    Alcohol/week: 2.0 standard drinks    Types: 2 Standard drinks or equivalent per week    Comment: occ  . Drug use: No  . Sexual activity: Not Currently  Other Topics Concern  . Not on file  Social History Narrative  . Not on file   Social Determinants of Health   Financial Resource Strain:   . Difficulty of Paying Living Expenses:   Food Insecurity:   . Worried About Programme researcher, broadcasting/film/video in the Last Year:   . Barista in the Last Year:   Transportation Needs:   . Freight forwarder (Medical):   Marland Kitchen Lack of Transportation (Non-Medical):   Physical Activity:   . Days of Exercise per Week:   . Minutes of Exercise per Session:   Stress:   . Feeling of Stress :   Social Connections:   . Frequency of  Communication with Friends and Family:   . Frequency of Social Gatherings with Friends and Family:   . Attends Religious Services:   . Active Member of Clubs or Organizations:   . Attends Banker Meetings:   Marland Kitchen Marital Status:     Outpatient Medications Prior to Visit  Medication Sig Dispense Refill  . amLODipine (NORVASC) 5 MG tablet Take 1 tablet (5 mg total) by mouth daily. 30 tablet 0  . escitalopram (LEXAPRO) 20 MG tablet Take 1 tablet (20 mg total) by mouth daily. 30 tablet 0  . Guaifenesin (MUCINEX MAXIMUM STRENGTH) 1200 MG TB12 Take 1 tablet by mouth 2 (two) times daily.    Marland Kitchen loratadine (CLARITIN) 10 MG tablet Take 1 tablet (10 mg total) by mouth daily. 30 tablet 0  . losartan-hydrochlorothiazide (HYZAAR) 100-25 MG tablet Take 1 tablet by mouth daily. 30 tablet 0  . Magnesium 250 MG TABS Take 1 tablet by mouth daily.    . montelukast (SINGULAIR) 10 MG tablet Take 10 mg by mouth daily.    . Omega-3 Fatty Acids (PRO NUTRIENTS OMEGA 3 PO) Take 200 mg by mouth daily.    . diclofenac (VOLTAREN) 75 MG EC tablet Take 1 tablet (75 mg total) by mouth 2 (two) times daily. (Patient not  taking: Reported on 01/14/2020) 60 tablet 2  . Garcinia Cambogia-Chromium 500-200 MG-MCG TABS Take by mouth. TAKE AS DIRECTED (Patient not taking: Reported on 01/14/2020)    . Simethicone (GAS-X EXTRA STRENGTH) 125 MG CAPS Take by mouth. Use as needed (Patient not taking: Reported on 01/14/2020)    . [place] Wort 300 MG TABS Take 1 tablet by mouth 2 (two) times daily. (Patient not taking: Reported on 01/14/2020)    . tolterodine (DETROL LA) 2 MG 24 hr capsule Take 1 capsule (2 mg total) by mouth daily. (Patient not taking: Reported on 01/14/2020) 90 capsule 3   No facility-administered medications prior to visit.     EXAM:  BP 108/68   Pulse 77   Temp 98.9 F (37.2 C) (Oral)   Ht 5\' 5"  (1.651 m)   Wt (!) 267 lb 3.2 oz (121.2 kg)   SpO2 98%   BMI 44.46 kg/m   Body mass index is 44.46 kg/m.  GENERAL: vitals reviewed and listed above, alert, oriented, appears well hydrated and in no acute distress HEENT: atraumatic, conjunctiva  clear, no obvious abnormalities on inspection of external nose and ears OP : masked NECK: no obvious masses on inspection palpation  LUNGS: clear to auscultation bilaterally, no wheezes, rales or rhonchi, good air movement CV: HRRR, no clubbing cyanosis or  peripheral edema nl cap refill  MS: moves all extremities without noticeable focal  Abnormality Abdomen:  Sof,t normal bowel sounds without hepatosplenomegaly, no guarding rebound or masses no CVA tenderness Neuro grossly nl and no fasciculation  No  Atrophy  sens in tack grossly   PSYCH: pleasant and cooperative, no obvious depression or anxiety Lab Results  Component Value Date   WBC 6.4 01/14/2020   HGB 14.3 01/14/2020   HCT 43.0 01/14/2020   PLT 274 01/14/2020   GLUCOSE 88 04/24/2018   CHOL 212 (H) 04/24/2018   TRIG 136 04/24/2018   HDL 50 (L) 04/24/2018   LDLCALC 136 (H) 04/24/2018   ALT 20 04/24/2018   AST 20 04/24/2018   NA 142 04/24/2018   K 3.7 04/24/2018   CL 105 04/24/2018   CREATININE  1.11 (H) 04/24/2018   BUN 32 (H) 04/24/2018  CO2 26 04/24/2018   TSH 1.09 04/24/2018   HGBA1C 6.0 02/27/2017   BP Readings from Last 3 Encounters:  01/14/20 108/68  04/24/18 126/82  11/03/17 130/84    ASSESSMENT AND PLAN:  Discussed the following assessment and plan:  Tingling - sound like sensory neuro pain ;reassuring exam  lab for metabolic  check - Plan: Basic metabolic panel, CBC with Differential/Platelet, Hemoglobin A1c, Hepatic function panel, TSH, T4, free, T3, free, Magnesium, Vitamin B12, Vitamin B12, Magnesium, T3, free, T4, free, TSH, Hepatic function panel, Hemoglobin A1c, CBC with Differential/Platelet, Basic metabolic panel  Essential hypertension - her wrist monitor not correlating w offic readings  - Plan: Basic metabolic panel, CBC with Differential/Platelet, Hemoglobin A1c, Hepatic function panel, TSH, T4, free, T3, free, Magnesium, Vitamin B12, Vitamin B12, Magnesium, T3, free, T4, free, TSH, Hepatic function panel, Hemoglobin A1c, CBC with Differential/Platelet, Basic metabolic panel  Medication management - Plan: Basic metabolic panel, CBC with Differential/Platelet, Hemoglobin A1c, Hepatic function panel, TSH, T4, free, T3, free, Magnesium, Vitamin B12, Vitamin B12, Magnesium, T3, free, T4, free, TSH, Hepatic function panel, Hemoglobin A1c, CBC with Differential/Platelet, Basic metabolic panel  SARCOIDOSIS, 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 In interim  Plan fu in 2-3 weeks  With pcp  Lab today   Patient Instructions  Getting lab today  Plan fu dr Clent Ridges in about 2 weeks  Get up and move   Get a bigger  Cuff for  Arm to fit arm . To get more accurate readings  Your exam is reassuring  .   Stay off the supplements at this time in case .  Get up frequently from  Work staaion and stay hydrated .     Neta Mends. Britiny Defrain M.D.

## 2020-01-14 NOTE — Patient Instructions (Addendum)
Getting lab today  Plan fu dr Clent Ridges in about 2 weeks  Get up and move   Get a bigger  Cuff for  Arm to fit arm . To get more accurate readings  Your exam is reassuring  .   Stay off the supplements at this time in case .  Get up frequently from  Work staaion and stay hydrated .

## 2020-01-15 LAB — BASIC METABOLIC PANEL
BUN/Creatinine Ratio: 13 (calc) (ref 6–22)
BUN: 14 mg/dL (ref 7–25)
CO2: 27 mmol/L (ref 20–32)
Calcium: 9.5 mg/dL (ref 8.6–10.4)
Chloride: 104 mmol/L (ref 98–110)
Creat: 1.12 mg/dL — ABNORMAL HIGH (ref 0.50–0.99)
Glucose, Bld: 110 mg/dL — ABNORMAL HIGH (ref 65–99)
Potassium: 3.5 mmol/L (ref 3.5–5.3)
Sodium: 141 mmol/L (ref 135–146)

## 2020-01-15 LAB — CBC WITH DIFFERENTIAL/PLATELET
Absolute Monocytes: 518 cells/uL (ref 200–950)
Basophils Absolute: 51 cells/uL (ref 0–200)
Basophils Relative: 0.8 %
Eosinophils Absolute: 166 cells/uL (ref 15–500)
Eosinophils Relative: 2.6 %
HCT: 43 % (ref 35.0–45.0)
Hemoglobin: 14.3 g/dL (ref 11.7–15.5)
Lymphs Abs: 1402 cells/uL (ref 850–3900)
MCH: 28.2 pg (ref 27.0–33.0)
MCHC: 33.3 g/dL (ref 32.0–36.0)
MCV: 84.8 fL (ref 80.0–100.0)
MPV: 9.3 fL (ref 7.5–12.5)
Monocytes Relative: 8.1 %
Neutro Abs: 4262 cells/uL (ref 1500–7800)
Neutrophils Relative %: 66.6 %
Platelets: 274 10*3/uL (ref 140–400)
RBC: 5.07 10*6/uL (ref 3.80–5.10)
RDW: 12.7 % (ref 11.0–15.0)
Total Lymphocyte: 21.9 %
WBC: 6.4 10*3/uL (ref 3.8–10.8)

## 2020-01-15 LAB — MAGNESIUM: Magnesium: 2 mg/dL (ref 1.5–2.5)

## 2020-01-15 LAB — HEPATIC FUNCTION PANEL
AG Ratio: 1.4 (calc) (ref 1.0–2.5)
ALT: 22 U/L (ref 6–29)
AST: 33 U/L (ref 10–35)
Albumin: 4.3 g/dL (ref 3.6–5.1)
Alkaline phosphatase (APISO): 112 U/L (ref 37–153)
Bilirubin, Direct: 0.1 mg/dL (ref 0.0–0.2)
Globulin: 3.1 g/dL (calc) (ref 1.9–3.7)
Indirect Bilirubin: 0.4 mg/dL (calc) (ref 0.2–1.2)
Total Bilirubin: 0.5 mg/dL (ref 0.2–1.2)
Total Protein: 7.4 g/dL (ref 6.1–8.1)

## 2020-01-15 LAB — HEMOGLOBIN A1C
Hgb A1c MFr Bld: 6 % of total Hgb — ABNORMAL HIGH (ref <5.7)
Mean Plasma Glucose: 126 (calc)
eAG (mmol/L): 7 (calc)

## 2020-01-15 LAB — VITAMIN B12: Vitamin B-12: 512 pg/mL (ref 200–1100)

## 2020-01-15 LAB — T3, FREE: T3, Free: 2.9 pg/mL (ref 2.3–4.2)

## 2020-01-15 LAB — TSH: TSH: 2.14 mIU/L (ref 0.40–4.50)

## 2020-01-15 LAB — T4, FREE: Free T4: 0.9 ng/dL (ref 0.8–1.8)

## 2020-01-16 NOTE — Progress Notes (Signed)
Good  news that  labs are stable or normal.  Doesn't of course explain the symptoms  but reassuring .  Keep you follow up with Dr Clent Ridges

## 2020-01-19 DIAGNOSIS — Z20822 Contact with and (suspected) exposure to covid-19: Secondary | ICD-10-CM | POA: Diagnosis not present

## 2020-01-28 ENCOUNTER — Ambulatory Visit: Payer: BC Managed Care – PPO | Admitting: Family Medicine

## 2020-02-10 ENCOUNTER — Other Ambulatory Visit: Payer: Self-pay

## 2020-02-10 ENCOUNTER — Encounter: Payer: Self-pay | Admitting: Family Medicine

## 2020-02-10 ENCOUNTER — Ambulatory Visit: Payer: BC Managed Care – PPO | Admitting: Family Medicine

## 2020-02-10 VITALS — BP 130/80 | HR 95 | Temp 98.0°F | Wt 271.4 lb

## 2020-02-10 DIAGNOSIS — R202 Paresthesia of skin: Secondary | ICD-10-CM | POA: Diagnosis not present

## 2020-02-10 DIAGNOSIS — E559 Vitamin D deficiency, unspecified: Secondary | ICD-10-CM | POA: Diagnosis not present

## 2020-02-10 DIAGNOSIS — Z0000 Encounter for general adult medical examination without abnormal findings: Secondary | ICD-10-CM

## 2020-02-10 LAB — VITAMIN D 25 HYDROXY (VIT D DEFICIENCY, FRACTURES): Vit D, 25-Hydroxy: 11 ng/mL — ABNORMAL LOW (ref 30–100)

## 2020-02-10 MED ORDER — LOSARTAN POTASSIUM 100 MG PO TABS: 100.0000 mg | ORAL_TABLET | Freq: Every day | ORAL | 3 refills | Status: DC

## 2020-02-10 NOTE — Progress Notes (Signed)
   Subjective:    Patient ID: Janice Bright, female    DOB: 04-Apr-1956, 64 y.o.   MRN: [id number]  HPI Here to follow up on tingling all over her body that started one month ago. She describes this as feeling like a needle is jabbing her skin , and this happens all over her body. It comes and goes. No visible rash. She saw Dr. Fabian Sharp for this on 01-14-20 and she had extensive lab work drawn. All of this returned as unremarkable except for some slight renal insufficiency. Her BP has been stable. Since then she has tried to drink more water every day, and the symptoms have lessened but not resolved.    Review of Systems  Constitutional: Negative.   Respiratory: Negative.   Cardiovascular: Negative.   Neurological: Negative for numbness.       Objective:   Physical Exam Constitutional:      Appearance: Normal appearance.  Cardiovascular:     Rate and Rhythm: Normal rate and regular rhythm.     Pulses: Normal pulses.     Heart sounds: Normal heart sounds.  Pulmonary:     Effort: Pulmonary effort is normal.     Breath sounds: Normal breath sounds.  Musculoskeletal:     Right lower leg: No edema.     Left lower leg: No edema.  Skin:    General: Skin is warm and dry.     Findings: No rash.  Neurological:     Mental Status: She is alert.           Assessment & Plan:  Parasthesias of uncertain origin. We will check a vitamin D level. She will switch to a hypoallergenic detergent, soap, etc. She will also start taking Claritin or some other antihistamine daily. Finally we will remove HCTZ from her medications and change to plain Losartan.  Gershon Crane, MD

## 2020-02-14 ENCOUNTER — Telehealth: Payer: Self-pay

## 2020-02-14 MED ORDER — VITAMIN D (ERGOCALCIFEROL) 1.25 MG (50000 UNIT) PO CAPS: 50000.0000 [IU] | ORAL_CAPSULE | ORAL | 3 refills | Status: DC

## 2020-02-14 NOTE — Telephone Encounter (Signed)
Rx sent in per result notes.

## 2020-04-07 DIAGNOSIS — Z20822 Contact with and (suspected) exposure to covid-19: Secondary | ICD-10-CM | POA: Diagnosis not present

## 2020-04-13 ENCOUNTER — Ambulatory Visit (INDEPENDENT_AMBULATORY_CARE_PROVIDER_SITE_OTHER): Payer: BC Managed Care – PPO | Admitting: Family Medicine

## 2020-04-13 ENCOUNTER — Other Ambulatory Visit: Payer: Self-pay

## 2020-04-13 ENCOUNTER — Encounter: Payer: Self-pay | Admitting: Family Medicine

## 2020-04-13 VITALS — BP 144/86 | HR 89 | Temp 98.9°F | Ht 65.0 in | Wt 270.8 lb

## 2020-04-13 DIAGNOSIS — E559 Vitamin D deficiency, unspecified: Secondary | ICD-10-CM | POA: Diagnosis not present

## 2020-04-13 DIAGNOSIS — Z0000 Encounter for general adult medical examination without abnormal findings: Secondary | ICD-10-CM

## 2020-04-13 DIAGNOSIS — Z1231 Encounter for screening mammogram for malignant neoplasm of breast: Secondary | ICD-10-CM | POA: Diagnosis not present

## 2020-04-13 MED ORDER — ESCITALOPRAM OXALATE 20 MG PO TABS: 20.0000 mg | ORAL_TABLET | Freq: Every day | ORAL | 3 refills | Status: DC

## 2020-04-13 MED ORDER — AMOXICILLIN-POT CLAVULANATE 875-125 MG PO TABS: 1.0000 | ORAL_TABLET | Freq: Two times a day (BID) | ORAL | 0 refills | Status: DC

## 2020-04-13 MED ORDER — AMLODIPINE BESYLATE 5 MG PO TABS: 5.0000 mg | ORAL_TABLET | Freq: Every day | ORAL | 3 refills | Status: DC

## 2020-04-13 MED ORDER — MONTELUKAST SODIUM 10 MG PO TABS: 10.0000 mg | ORAL_TABLET | Freq: Every day | ORAL | 3 refills | Status: DC

## 2020-04-13 NOTE — Progress Notes (Signed)
Subjective:    Patient ID: Janice Bright, female    DOB: 07-Oct-1955, 64 y.o.   MRN: [id number]  HPI Here for a well exam and for several issues. We saw her in August for numbness and tingling all over her body, and labs revealed a very low vitamin D level at 11. She was started on 50,000 unit capsules of vitamin D weekly, and the paresthesias have totally gone away. Also for the past week she has had sinus congestion, PND, and is coughing up clear sputum. No chest pain or fever or SOB. She had a negative Covid test a few days ago. Drinking fluids.    Review of Systems  Constitutional: Negative.   HENT: Positive for congestion and postnasal drip.   Eyes: Negative.   Respiratory: Positive for cough.   Cardiovascular: Negative.   Gastrointestinal: Negative.   Genitourinary: Negative for decreased urine volume, difficulty urinating, dyspareunia, dysuria, enuresis, flank pain, frequency, hematuria, pelvic pain and urgency.  Musculoskeletal: Negative.   Skin: Negative.   Neurological: Negative.   Psychiatric/Behavioral: Negative.        Objective:   Physical Exam Constitutional:      General: She is not in acute distress.    Appearance: She is well-developed.  HENT:     Head: Normocephalic and atraumatic.     Right Ear: External ear normal.     Left Ear: External ear normal.     Nose: Nose normal.     Mouth/Throat:     Pharynx: No oropharyngeal exudate.  Eyes:     General: No scleral icterus.    Conjunctiva/sclera: Conjunctivae normal.     Pupils: Pupils are equal, round, and reactive to light.  Neck:     Thyroid: No thyromegaly.     Vascular: No JVD.  Cardiovascular:     Rate and Rhythm: Normal rate and regular rhythm.     Heart sounds: Normal heart sounds. No murmur heard.  No friction rub. No gallop.   Pulmonary:     Effort: Pulmonary effort is normal. No respiratory distress.     Breath sounds: Normal breath sounds. No wheezing or rales.  Chest:     Chest wall: No  tenderness.  Abdominal:     General: Bowel sounds are normal. There is no distension.     Palpations: Abdomen is soft. There is no mass.     Tenderness: There is no abdominal tenderness. There is no guarding or rebound.  Musculoskeletal:        General: No tenderness. Normal range of motion.     Cervical back: Normal range of motion and neck supple.  Lymphadenopathy:     Cervical: No cervical adenopathy.  Skin:    General: Skin is warm and dry.     Findings: No erythema or rash.  Neurological:     Mental Status: She is alert and oriented to person, place, and time.     Cranial Nerves: No cranial nerve deficit.     Motor: No abnormal muscle tone.     Coordination: Coordination normal.     Deep Tendon Reflexes: Reflexes are normal and symmetric. Reflexes normal.  Psychiatric:        Behavior: Behavior normal.        Thought Content: Thought content normal.        Judgment: Judgment normal.           Assessment & Plan:  Well exam. We discussed diet and exercise. Get fasting labs tomorrow. The paresthesias  have responded to the vitamin D supplementation so we will continue this. Check a level tomorrow. She has sinusitis which we will treat with Augmentin for 10 days. She was due for another colonoscopy this year, so she will contact the GI office to set up one soon. Gershon Crane, MD

## 2020-04-14 ENCOUNTER — Other Ambulatory Visit (INDEPENDENT_AMBULATORY_CARE_PROVIDER_SITE_OTHER): Payer: BC Managed Care – PPO

## 2020-04-14 DIAGNOSIS — E559 Vitamin D deficiency, unspecified: Secondary | ICD-10-CM

## 2020-04-14 DIAGNOSIS — Z0000 Encounter for general adult medical examination without abnormal findings: Secondary | ICD-10-CM | POA: Diagnosis not present

## 2020-04-15 LAB — LIPID PANEL
Cholesterol: 168 mg/dL (ref <200)
HDL: 47 mg/dL — ABNORMAL LOW (ref >=50)
LDL Cholesterol (Calc): 98 mg/dL (calc)
Non-HDL Cholesterol (Calc): 121 mg/dL (calc) (ref <130)
Total CHOL/HDL Ratio: 3.6 (calc) (ref <5.0)
Triglycerides: 125 mg/dL (ref <150)

## 2020-04-15 LAB — VITAMIN D 25 HYDROXY (VIT D DEFICIENCY, FRACTURES): Vit D, 25-Hydroxy: 22 ng/mL — ABNORMAL LOW (ref 30–100)

## 2020-04-26 ENCOUNTER — Encounter: Payer: Self-pay | Admitting: Family Medicine

## 2020-04-26 DIAGNOSIS — R921 Mammographic calcification found on diagnostic imaging of breast: Secondary | ICD-10-CM | POA: Diagnosis not present

## 2020-04-26 DIAGNOSIS — R922 Inconclusive mammogram: Secondary | ICD-10-CM | POA: Diagnosis not present

## 2020-04-26 DIAGNOSIS — R928 Other abnormal and inconclusive findings on diagnostic imaging of breast: Secondary | ICD-10-CM | POA: Diagnosis not present

## 2020-04-27 ENCOUNTER — Telehealth: Payer: Self-pay | Admitting: Family Medicine

## 2020-04-27 NOTE — Telephone Encounter (Signed)
Returned patients call went over labs no concerns at this time

## 2020-04-27 NOTE — Telephone Encounter (Signed)
Pt was returning the call to the office for her lab results and stated that she can be reached at her work number [phone] up until 12:00 and after that you can reach her on her mobile number [phone].

## 2020-05-04 ENCOUNTER — Encounter: Payer: Self-pay | Admitting: Family Medicine

## 2020-05-09 ENCOUNTER — Encounter: Payer: Self-pay | Admitting: Family Medicine

## 2020-05-09 ENCOUNTER — Other Ambulatory Visit: Payer: Self-pay | Admitting: Radiology

## 2020-05-09 DIAGNOSIS — N6489 Other specified disorders of breast: Secondary | ICD-10-CM | POA: Diagnosis not present

## 2020-05-09 DIAGNOSIS — R921 Mammographic calcification found on diagnostic imaging of breast: Secondary | ICD-10-CM | POA: Diagnosis not present

## 2020-09-13 ENCOUNTER — Telehealth (INDEPENDENT_AMBULATORY_CARE_PROVIDER_SITE_OTHER): Payer: 59 | Admitting: Family Medicine

## 2020-09-13 ENCOUNTER — Encounter: Payer: Self-pay | Admitting: Family Medicine

## 2020-09-13 ENCOUNTER — Telehealth: Payer: Self-pay

## 2020-09-13 VITALS — Wt 270.0 lb

## 2020-09-13 DIAGNOSIS — Z0000 Encounter for general adult medical examination without abnormal findings: Secondary | ICD-10-CM

## 2020-09-13 DIAGNOSIS — J40 Bronchitis, not specified as acute or chronic: Secondary | ICD-10-CM | POA: Diagnosis not present

## 2020-09-13 MED ORDER — AMOXICILLIN-POT CLAVULANATE 875-125 MG PO TABS: 1.0000 | ORAL_TABLET | Freq: Two times a day (BID) | ORAL | 0 refills | Status: DC

## 2020-09-13 NOTE — Telephone Encounter (Signed)
Pt was called and scheduled virtually with Dr. Clent Ridges

## 2020-09-13 NOTE — Progress Notes (Signed)
Subjective:    Patient ID: Janice Bright, female    DOB: [birth year], 65 y.o.   MRN: [id number]  HPI Virtual Visit via Video Note  I connected with the patient on 09/13/20 at  1:45 PM EDT by a video enabled telemedicine application and verified that I am speaking with the correct person using two identifiers.  Location patient: home Location provider:work or home office Persons participating in the virtual visit: patient, provider  I discussed the limitations of evaluation and management by telemedicine and the availability of in person appointments. The patient expressed understanding and agreed to proceed.   HPI: Here for 7 days of stuffy head, PND ,chest tightness and a dry cough. No fever or SOB. No NVD. She has some sharp pain in the left chest area which hurts when she takes a deep breath or when she raises her arms above her head. Using Robitussin and Ibuprofen.    ROS: See pertinent positives and negatives per HPI.  Past Medical History:  Diagnosis Date  . Anxiety   . Colitis    sees Dr. Arlyce Dice  . Compression, spinal, spondylogenic, cervical   . Depression   . Gynecological examination    sees Dr. Beatrix Shipper  . Hypertension   . Normal eye exam    sees Dr. Mitzi Davenport   . Normal spontaneous vaginal delivery    3  . Sarcoidosis    withlung involvement, sees Dr. Lovie Macadamia    Past Surgical History:  Procedure Laterality Date  . benign cyst removed from the right breast  2009  . COLONOSCOPY  08-09-09   per Dr. Arlyce Dice, diverticulosis only, repeat in 10 yrs   . OOPHORECTOMY    . TOTAL ABDOMINAL HYSTERECTOMY  BSO    Family History  Problem Relation Age of Onset  . Heart disease Mother   . Diabetes Father   . Hypertension Other   . Cancer Other        colon     Current Outpatient Medications:  .  amLODipine (NORVASC) 5 MG tablet, Take 1 tablet (5 mg total) by mouth daily., Disp: 90 tablet, Rfl: 3 .  escitalopram (LEXAPRO) 20 MG tablet, Take 1 tablet (20 mg total)  by mouth daily., Disp: 90 tablet, Rfl: 3 .  Guaifenesin 1200 MG TB12, Take 1 tablet by mouth 2 (two) times daily., Disp: , Rfl:  .  loratadine (CLARITIN) 10 MG tablet, Take 1 tablet (10 mg total) by mouth daily., Disp: 30 tablet, Rfl: 0 .  losartan (COZAAR) 100 MG tablet, Take 1 tablet (100 mg total) by mouth daily., Disp: 90 tablet, Rfl: 3 .  Magnesium 250 MG TABS, Take 1 tablet by mouth daily., Disp: , Rfl:  .  montelukast (SINGULAIR) 10 MG tablet, Take 1 tablet (10 mg total) by mouth daily. 90, Disp: 90 tablet, Rfl: 3 .  Omega-3 Fatty Acids (PRO NUTRIENTS OMEGA 3 PO), Take 200 mg by mouth daily., Disp: , Rfl:  .  Vitamin D, Ergocalciferol, (DRISDOL) 1.25 MG (50000 UNIT) CAPS capsule, Take 1 capsule (50,000 Units total) by mouth once a week., Disp: 12 capsule, Rfl: 3  EXAM:  VITALS per patient if applicable:  GENERAL: alert, oriented, appears well and in no acute distress  HEENT: atraumatic, conjunttiva clear, no obvious abnormalities on inspection of external nose and ears  NECK: normal movements of the head and neck  LUNGS: on inspection no signs of respiratory distress, breathing rate appears normal, no obvious gross SOB, gasping or wheezing  CV: no  obvious cyanosis  MS: moves all visible extremities without noticeable abnormality  PSYCH/NEURO: pleasant and cooperative, no obvious depression or anxiety, speech and thought processing grossly intact  ASSESSMENT AND PLAN: Bronchitis, treat with Augmentin for 10 days. I also suggested she take a Covid-19 test. The chest pain is musculoskeletal from the coughing, and I reassured her this is benign and temporary. Gershon Crane, MD  Discussed the following assessment and plan:  No diagnosis found.     I discussed the assessment and treatment plan with the patient. The patient was provided an opportunity to ask questions and all were answered. The patient agreed with the plan and demonstrated an understanding of the instructions.    The patient was advised to call back or seek an in-person evaluation if the symptoms worsen or if the condition fails to improve as anticipated.     Review of Systems     Objective:   Physical Exam        Assessment & Plan:

## 2020-09-13 NOTE — Telephone Encounter (Signed)
Pt c/o a cough x 7 days Left side pain in the breast area that she suspects has been caused by the cough.  Also has some wheezing.   CB number is: [phone]  Nurse says pt will need to be seen in the next 4 hrs or she will need to go to UC.

## 2020-11-21 ENCOUNTER — Other Ambulatory Visit: Payer: Self-pay | Admitting: Family Medicine

## 2020-12-13 ENCOUNTER — Encounter: Payer: Self-pay | Admitting: Family Medicine

## 2020-12-14 NOTE — Telephone Encounter (Signed)
The referral is still in effect. Have her contact the GI office herself to schedule this

## 2020-12-15 ENCOUNTER — Encounter: Payer: Self-pay | Admitting: Gastroenterology

## 2021-02-08 ENCOUNTER — Other Ambulatory Visit: Payer: Self-pay

## 2021-02-08 ENCOUNTER — Ambulatory Visit (AMBULATORY_SURGERY_CENTER): Payer: Self-pay

## 2021-02-08 VITALS — Ht 65.0 in | Wt 262.0 lb

## 2021-02-08 DIAGNOSIS — Z1211 Encounter for screening for malignant neoplasm of colon: Secondary | ICD-10-CM

## 2021-02-08 MED ORDER — PEG-KCL-NACL-NASULF-NA ASC-C 100 G PO SOLR: 1.0000 | Freq: Once | ORAL | 0 refills | Status: AC

## 2021-02-08 NOTE — Progress Notes (Signed)
No allergies to soy or egg Pt is not on blood thinners or diet pills Denies issues with sedation/intubation Denies atrial flutter/fib Denies constipation   Emmi instructions given to pt  Pt is aware of Covid safety and care partner requirements.  

## 2021-02-22 ENCOUNTER — Encounter: Payer: Self-pay | Admitting: Gastroenterology

## 2021-02-22 ENCOUNTER — Ambulatory Visit (AMBULATORY_SURGERY_CENTER): Payer: 59 | Admitting: Gastroenterology

## 2021-02-22 ENCOUNTER — Other Ambulatory Visit: Payer: Self-pay

## 2021-02-22 VITALS — BP 130/76 | HR 71 | Temp 97.1°F | Resp 17 | Ht 65.0 in | Wt 262.0 lb

## 2021-02-22 DIAGNOSIS — R197 Diarrhea, unspecified: Secondary | ICD-10-CM | POA: Diagnosis present

## 2021-02-22 DIAGNOSIS — K5900 Constipation, unspecified: Secondary | ICD-10-CM | POA: Diagnosis not present

## 2021-02-22 DIAGNOSIS — K5730 Diverticulosis of large intestine without perforation or abscess without bleeding: Secondary | ICD-10-CM | POA: Diagnosis not present

## 2021-02-22 DIAGNOSIS — Z1211 Encounter for screening for malignant neoplasm of colon: Secondary | ICD-10-CM

## 2021-02-22 HISTORY — PX: COLONOSCOPY: SHX174

## 2021-02-22 MED ORDER — SODIUM CHLORIDE 0.9 % IV SOLN
500.0000 mL | Freq: Once | INTRAVENOUS | Status: DC
Start: 2021-02-22 — End: 2021-02-22

## 2021-02-22 NOTE — Progress Notes (Signed)
VS- Janice Bright  Pt's states no medical or surgical changes since previsit or office visit.  

## 2021-02-22 NOTE — Progress Notes (Signed)
Sedate, gd SR, tolerated procedure well, VSS, report to RN 

## 2021-02-22 NOTE — Progress Notes (Signed)
Called to room to assist during endoscopic procedure.  Patient ID and intended procedure confirmed with present staff. Received instructions for my participation in the procedure from the performing physician.  

## 2021-02-22 NOTE — Op Note (Signed)
[facility] Patient Name: Janice Bright Procedure Date: 02/22/2021 9:36 AM MRN: [id number] Endoscopist: Sherilyn Cooter L. Myrtie Neither , MD Age: 65 Referring MD:  Date of Birth: 1956/03/01 Gender: Female Account #: [id number] Procedure:                Colonoscopy Indications:              Screening for colorectal malignant neoplasm,                            Incidental diarrhea and constipation noted Medicines:                Monitored Anesthesia Care Procedure:                Pre-Anesthesia Assessment:                           - Prior to the procedure, a History and Physical                            was performed, and patient medications and                            allergies were reviewed. The patient's tolerance of                            previous anesthesia was also reviewed. The risks                            and benefits of the procedure and the sedation                            options and risks were discussed with the patient.                            All questions were answered, and informed consent                            was obtained. Prior Anticoagulants: The patient has                            taken no previous anticoagulant or antiplatelet                            agents. ASA Grade Assessment: II - A patient with                            mild systemic disease. After reviewing the risks                            and benefits, the patient was deemed in                            satisfactory condition to undergo the procedure.  After obtaining informed consent, the colonoscope                            was passed under direct vision. Throughout the                            procedure, the patient's blood pressure, pulse, and                            oxygen saturations were monitored continuously. The                            Olympus CF-HQ190L [id number]) Colonoscope was                            introduced through the anus  and advanced to the the                            cecum, identified by appendiceal orifice and                            ileocecal valve. The colonoscopy was performed with                            difficulty due to a redundant colon, significant                            looping and the patient's body habitus. Successful                            completion of the procedure was aided by using                            manual pressure and straightening and shortening                            the scope to obtain bowel loop reduction. The                            patient tolerated the procedure fairly well. The                            quality of the bowel preparation was good. The                            ileocecal valve, appendiceal orifice, and rectum                            were photographed. Scope In: 9:46:57 AM Scope Out: 10:05:56 AM Scope Withdrawal Time: 0 hours 13 minutes 56 seconds  Total Procedure Duration: 0 hours 18 minutes 59 seconds  Findings:                 The digital rectal exam findings include decreased  sphincter tone.                           Multiple diverticula were found in the left colon                            and right colon (L>R).                           Normal mucosa was found in the entire colon.                            Biopsies for histology were taken with a cold                            forceps from the ascending colon, transverse colon                            and descending colon for evaluation of microscopic                            colitis.                           There is no endoscopic evidence of polyps in the                            entire colon. Complications:            No immediate complications. Estimated Blood Loss:     Estimated blood loss was minimal. Impression:               - Decreased sphincter tone found on digital rectal                            exam.                            - Diverticulosis in the left colon and in the right                            colon.                           - Normal mucosa in the entire examined colon.                            Biopsied. Recommendation:           - Patient has a contact number available for                            emergencies. The signs and symptoms of potential                            delayed complications were discussed with the  patient. Return to normal activities tomorrow.                            Written discharge instructions were provided to the                            patient.                           - Resume previous diet.                           - Continue present medications.                           - Await pathology results.                           - Repeat colonoscopy in 10 years for screening                            purposes. Shonette Rhames L. Myrtie Neither, MD 02/22/2021 10:10:33 AM This report has been signed electronically.

## 2021-02-22 NOTE — Progress Notes (Deleted)
A/ox3, pleased with MAC, report to RN 

## 2021-02-22 NOTE — Patient Instructions (Signed)
YOU HAD AN ENDOSCOPIC PROCEDURE TODAY AT THE [facility]:   Refer to the procedure report that was given to you for any specific questions about what was found during the examination.  If the procedure report does not answer your questions, please call your gastroenterologist to clarify.  If you requested that your care partner not be given the details of your procedure findings, then the procedure report has been included in a sealed envelope for you to review at your convenience later.  YOU SHOULD EXPECT: Some feelings of bloating in the abdomen. Passage of more gas than usual.  Walking can help get rid of the air that was put into your GI tract during the procedure and reduce the bloating. If you had a lower endoscopy (such as a colonoscopy or flexible sigmoidoscopy) you may notice spotting of blood in your stool or on the toilet paper. If you underwent a bowel prep for your procedure, you may not have a normal bowel movement for a few days.  Please Note:  You might notice some irritation and congestion in your nose or some drainage.  This is from the oxygen used during your procedure.  There is no need for concern and it should clear up in a day or so.  SYMPTOMS TO REPORT IMMEDIATELY:   Following lower endoscopy (colonoscopy or flexible sigmoidoscopy):  Excessive amounts of blood in the stool  Significant tenderness or worsening of abdominal pains  Swelling of the abdomen that is new, acute  Fever of 100F or higher   Following upper endoscopy (EGD)  Vomiting of blood or coffee ground material  New chest pain or pain under the shoulder blades  Painful or persistently difficult swallowing  New shortness of breath  Fever of 100F or higher  Black, tarry-looking stools  For urgent or emergent issues, a gastroenterologist can be reached at any hour by calling [phone]. Do not use MyChart messaging for urgent concerns.    DIET:  We do recommend a small meal at first, but  then you may proceed to your regular diet.  Drink plenty of fluids but you should avoid alcoholic beverages for 24 hours.  ACTIVITY:  You should plan to take it easy for the rest of today and you should NOT DRIVE or use heavy machinery until tomorrow (because of the sedation medicines used during the test).    FOLLOW UP: Our staff will call the number listed on your records 48-72 hours following your procedure to check on you and address any questions or concerns that you may have regarding the information given to you following your procedure. If we do not reach you, we will leave a message.  We will attempt to reach you two times.  During this call, we will ask if you have developed any symptoms of COVID 19. If you develop any symptoms (ie: fever, flu-like symptoms, shortness of breath, cough etc.) before then, please call [phone].  If you test positive for Covid 19 in the 2 weeks post procedure, please call and report this information to us.    If any biopsies were taken you will be contacted by phone or by letter within the next 1-3 weeks.  Please call us at [phone] if you have not heard about the biopsies in 3 weeks.    SIGNATURES/CONFIDENTIALITY: You and/or your care partner have signed paperwork which will be entered into your electronic medical record.  These signatures attest to the fact that that the information above on   your After Visit Summary has been reviewed and is understood.  Full responsibility of the confidentiality of this discharge information lies with you and/or your care-partner. 

## 2021-02-22 NOTE — Progress Notes (Signed)
History and Physical:  This patient presents for endoscopic testing for: Encounter Diagnosis  Name Primary?   Special screening for malignant neoplasms, colon Yes   Patient reports intermittent diarrhea or constipation. No rectal bleeding    Past Medical History: Past Medical History:  Diagnosis Date   Anxiety    Colitis    sees Dr. Arlyce Dice   Compression, spinal, spondylogenic, cervical    Depression    GERD (gastroesophageal reflux disease)    Gynecological examination    sees Dr. Beatrix Shipper   Hypertension    Normal eye exam    sees Dr. Mitzi Davenport    Normal spontaneous vaginal delivery    3   Sarcoidosis    withlung involvement, sees Dr. Lovie Macadamia     Past Surgical History: Past Surgical History:  Procedure Laterality Date   benign cyst removed from the right breast  2009   COLONOSCOPY  08-09-09   per Dr. Arlyce Dice, diverticulosis only, repeat in 10 yrs    OOPHORECTOMY     TOTAL ABDOMINAL HYSTERECTOMY  BSO    Allergies: No Known Allergies  Outpatient Meds: Current Outpatient Medications  Medication Sig Dispense Refill   amLODipine (NORVASC) 5 MG tablet Take 1 tablet (5 mg total) by mouth daily. 90 tablet 3   escitalopram (LEXAPRO) 20 MG tablet Take 1 tablet (20 mg total) by mouth daily. 90 tablet 3   fluticasone (FLONASE) 50 MCG/ACT nasal spray Place into both nostrils daily.     losartan (COZAAR) 100 MG tablet Take 1 tablet (100 mg total) by mouth daily. 90 tablet 3   montelukast (SINGULAIR) 10 MG tablet Take 1 tablet (10 mg total) by mouth daily. 90 90 tablet 3   Omega-3 Fatty Acids (PRO NUTRIENTS OMEGA 3 PO) Take 200 mg by mouth daily.     OMEPRAZOLE PO Take by mouth.     Vitamin D, Ergocalciferol, (DRISDOL) 1.25 MG (50000 UNIT) CAPS capsule Take 1 capsule (50,000 Units total) by mouth once a week. 12 capsule 3   amoxicillin-clavulanate (AUGMENTIN) 875-125 MG tablet Take 1 tablet by mouth 2 (two) times daily. (Patient not taking: No sig reported) 20 tablet 0    Guaifenesin 1200 MG TB12 Take 1 tablet by mouth 2 (two) times daily. (Patient not taking: Reported on 02/22/2021)     loratadine (CLARITIN) 10 MG tablet Take 1 tablet (10 mg total) by mouth daily. (Patient not taking: No sig reported) 30 tablet 0   Magnesium 250 MG TABS Take 1 tablet by mouth daily. (Patient not taking: No sig reported)     Current Facility-Administered Medications  Medication Dose Route Frequency Provider Last Rate Last Admin   0.9 %  sodium chloride infusion  500 mL Intravenous Once Charlie Pitter III, MD          ___________________________________________________________________ Objective   Exam:  BP (!) 148/87   Pulse 90   Temp (!) 97.1 F (36.2 C)   Ht 5\' 5"  (1.651 m)   Wt 262 lb (118.8 kg)   SpO2 95%   BMI 43.60 kg/m   CV: RRR without murmur, S1/S2 Resp: clear to auscultation bilaterally, normal RR and effort noted GI: soft, no tenderness, with active bowel sounds.   Assessment: Encounter Diagnosis  Name Primary?   Special screening for malignant neoplasms, colon Yes     Plan: Colonoscopy  The benefits and risks of the planned procedure were described in detail with the patient or (when appropriate) their health care proxy.  Risks were outlined as including,  but not limited to, bleeding, infection, perforation, adverse medication reaction leading to cardiac or pulmonary decompensation, pancreatitis (if ERCP).  The limitation of incomplete mucosal visualization was also discussed.  No guarantees or warranties were given.    The patient is appropriate for an endoscopic procedure in the ambulatory setting.   - Wilfrid Lund, MD

## 2021-02-26 ENCOUNTER — Telehealth: Payer: Self-pay

## 2021-02-26 ENCOUNTER — Encounter: Payer: Self-pay | Admitting: Gastroenterology

## 2021-02-26 NOTE — Telephone Encounter (Signed)
Attempted f/u call. No answer, left VM. 

## 2021-02-26 NOTE — Telephone Encounter (Signed)
  Follow up Call-  Call back number 02/22/2021  Post procedure Call Back phone  # [phone]  Permission to leave phone message Yes  Some recent data might be hidden     Patient questions:  Do you have a fever, pain , or abdominal swelling? No. Pain Score  0 *  Have you tolerated food without any problems? Yes.    Have you been able to return to your normal activities? Yes.    Do you have any questions about your discharge instructions: Diet   No. Medications  No. Follow up visit  No.  Do you have questions or concerns about your Care? No.  Actions: * If pain score is 4 or above: No action needed, pain <4.    Have you developed a fever since your procedure? No   2.   Have you had an respiratory symptoms (SOB or cough) since your procedure? No   3.   Have you tested positive for COVID 19 since your procedure no   4.   Have you had any family members/close contacts diagnosed with the COVID 19 since your procedure?  No    If yes to any of these questions please route to Laverna Peace, RN and Karlton Lemon, RN

## 2021-03-10 ENCOUNTER — Other Ambulatory Visit: Payer: Self-pay | Admitting: Family Medicine

## 2021-04-02 ENCOUNTER — Ambulatory Visit
Admission: RE | Admit: 2021-04-02 | Discharge: 2021-04-02 | Disposition: A | Discharge status: Home / Self Care | Payer: 59 | Source: Ambulatory Visit | Attending: Family Medicine | Admitting: Family Medicine

## 2021-04-02 ENCOUNTER — Other Ambulatory Visit: Payer: Self-pay | Admitting: Family Medicine

## 2021-04-02 ENCOUNTER — Other Ambulatory Visit: Payer: Self-pay

## 2021-04-02 DIAGNOSIS — J690 Pneumonitis due to inhalation of food and vomit: Secondary | ICD-10-CM

## 2021-06-12 ENCOUNTER — Other Ambulatory Visit: Payer: Self-pay | Admitting: Family Medicine

## 2021-12-10 ENCOUNTER — Encounter: Payer: Self-pay | Admitting: Family Medicine

## 2021-12-10 ENCOUNTER — Ambulatory Visit (INDEPENDENT_AMBULATORY_CARE_PROVIDER_SITE_OTHER): Payer: Medicare Other | Admitting: Family Medicine

## 2021-12-10 VITALS — BP 128/80 | HR 95 | Temp 98.7°F | Resp 16 | Ht 65.0 in | Wt 252.4 lb

## 2021-12-10 DIAGNOSIS — R0989 Other specified symptoms and signs involving the circulatory and respiratory systems: Secondary | ICD-10-CM | POA: Diagnosis not present

## 2021-12-10 DIAGNOSIS — H9313 Tinnitus, bilateral: Secondary | ICD-10-CM

## 2022-04-03 ENCOUNTER — Telehealth: Payer: Self-pay | Admitting: Family Medicine

## 2022-04-03 NOTE — Telephone Encounter (Signed)
Left message for patient to call back and schedule Medicare Annual Wellness Visit (AWV) either virtually or in office. Left  my jabber number [phone]   *due 03/17/2022 awvi per [employer] please schedule with Nurse Health Adviser   45 min for awv-i and in office appointments 30 min for awv-s  phone/virtual appointments   

## 2022-04-30 ENCOUNTER — Telehealth: Payer: Self-pay | Admitting: Family Medicine

## 2022-04-30 NOTE — Telephone Encounter (Signed)
Left message for patient to call back and schedule Medicare Annual Wellness Visit (AWV) either virtually or in office. Left  my jabber number [phone]   *due 03/17/2022 awvi per [employer] please schedule with Nurse Health Adviser   45 min for awv-i and in office appointments 30 min for awv-s  phone/virtual appointments   

## 2022-05-07 ENCOUNTER — Ambulatory Visit: Payer: Medicare Other | Admitting: Family Medicine

## 2022-06-03 ENCOUNTER — Telehealth: Payer: Self-pay | Admitting: Family Medicine

## 2022-06-03 NOTE — Telephone Encounter (Signed)
Left message for patient to call back and schedule Medicare Annual Wellness Visit (AWV) either virtually or in office. Left  my jabber number [phone]   *due 03/17/2022 awvi per [employer] please schedule with Nurse Health Adviser   45 min for awv-i and in office appointments 30 min for awv-s  phone/virtual appointments   

## 2022-06-08 ENCOUNTER — Other Ambulatory Visit: Payer: Self-pay | Admitting: Family Medicine

## 2022-06-25 ENCOUNTER — Encounter: Payer: Self-pay | Admitting: Family Medicine

## 2022-06-25 ENCOUNTER — Ambulatory Visit (INDEPENDENT_AMBULATORY_CARE_PROVIDER_SITE_OTHER): Payer: Medicare Other | Admitting: Family Medicine

## 2022-06-25 VITALS — BP 118/82 | HR 90 | Temp 97.8°F | Ht 63.5 in | Wt 239.2 lb

## 2022-06-25 DIAGNOSIS — I10 Essential (primary) hypertension: Secondary | ICD-10-CM | POA: Diagnosis not present

## 2022-06-25 DIAGNOSIS — R609 Edema, unspecified: Secondary | ICD-10-CM | POA: Diagnosis not present

## 2022-06-25 DIAGNOSIS — D869 Sarcoidosis, unspecified: Secondary | ICD-10-CM

## 2022-06-25 DIAGNOSIS — F418 Other specified anxiety disorders: Secondary | ICD-10-CM

## 2022-06-25 DIAGNOSIS — E538 Deficiency of other specified B group vitamins: Secondary | ICD-10-CM | POA: Diagnosis not present

## 2022-06-25 DIAGNOSIS — K219 Gastro-esophageal reflux disease without esophagitis: Secondary | ICD-10-CM | POA: Diagnosis not present

## 2022-06-25 DIAGNOSIS — R739 Hyperglycemia, unspecified: Secondary | ICD-10-CM | POA: Diagnosis not present

## 2022-06-25 DIAGNOSIS — R519 Headache, unspecified: Secondary | ICD-10-CM

## 2022-06-25 DIAGNOSIS — Z23 Encounter for immunization: Secondary | ICD-10-CM

## 2022-06-25 DIAGNOSIS — N1832 Chronic kidney disease, stage 3b: Secondary | ICD-10-CM

## 2022-06-25 DIAGNOSIS — H9193 Unspecified hearing loss, bilateral: Secondary | ICD-10-CM

## 2022-06-25 DIAGNOSIS — E559 Vitamin D deficiency, unspecified: Secondary | ICD-10-CM | POA: Diagnosis not present

## 2022-06-25 DIAGNOSIS — K582 Mixed irritable bowel syndrome: Secondary | ICD-10-CM

## 2022-06-25 DIAGNOSIS — H9313 Tinnitus, bilateral: Secondary | ICD-10-CM

## 2022-06-25 DIAGNOSIS — G8929 Other chronic pain: Secondary | ICD-10-CM

## 2022-06-25 MED ORDER — AMLODIPINE BESYLATE 5 MG PO TABS: 5.0000 mg | ORAL_TABLET | Freq: Every day | ORAL | 3 refills | Status: DC

## 2022-06-25 MED ORDER — VITAMIN D (ERGOCALCIFEROL) 1.25 MG (50000 UNIT) PO CAPS: 50000.0000 [IU] | ORAL_CAPSULE | ORAL | 3 refills | Status: DC

## 2022-06-25 MED ORDER — ESCITALOPRAM OXALATE 20 MG PO TABS: 20.0000 mg | ORAL_TABLET | Freq: Every day | ORAL | 3 refills | Status: DC

## 2022-06-25 MED ORDER — MONTELUKAST SODIUM 10 MG PO TABS: 10.0000 mg | ORAL_TABLET | Freq: Every day | ORAL | 3 refills | Status: DC

## 2022-06-25 MED ORDER — DIAZEPAM 5 MG PO TABS: 5.0000 mg | ORAL_TABLET | Freq: Two times a day (BID) | ORAL | 0 refills | Status: AC | PRN

## 2022-06-25 MED ORDER — LOSARTAN POTASSIUM 100 MG PO TABS: 100.0000 mg | ORAL_TABLET | Freq: Every day | ORAL | 3 refills | Status: DC

## 2022-06-25 NOTE — Progress Notes (Signed)
Subjective:    Patient ID: Janice Bright, female    DOB: March 05, 1956, 67 y.o.   MRN: [id number]  HPI Here to follow up on issues. Her BP has been stable. Her GERD has been stable, and she has been able to stop taking Omeprazole. Her depression has been stable. She has a few problems to discuss. First she has noticed some hearing loss in both ears over the past few years, and now for 8 months she has had ringing in both ears. No dizziness or balance problems. She has also had frequent headaches, almost daily, which are centered in the left side of her head. There is no nausea or light sensitivity with these. She has also had some occasional drooping of the left side of her mouth, but there is no numbness or tingling of the face. No problems with arms or legs, no vision disturbances .    Review of Systems  Constitutional: Negative.   HENT:  Positive for hearing loss and tinnitus.   Eyes: Negative.   Respiratory: Negative.    Cardiovascular: Negative.   Gastrointestinal: Negative.   Genitourinary:  Negative for decreased urine volume, difficulty urinating, dyspareunia, dysuria, enuresis, flank pain, frequency, hematuria, pelvic pain and urgency.  Musculoskeletal: Negative.   Skin: Negative.   Neurological:  Positive for weakness and headaches.  Psychiatric/Behavioral: Negative.         Objective:   Physical Exam Constitutional:      General: She is not in acute distress.    Appearance: She is well-developed. She is obese.  HENT:     Head: Normocephalic and atraumatic.     Right Ear: External ear normal.     Left Ear: External ear normal.     Nose: Nose normal.     Mouth/Throat:     Pharynx: No oropharyngeal exudate.  Eyes:     General: No scleral icterus.    Conjunctiva/sclera: Conjunctivae normal.     Pupils: Pupils are equal, round, and reactive to light.  Neck:     Thyroid: No thyromegaly.     Vascular: No JVD.  Cardiovascular:     Rate and Rhythm: Normal rate and  regular rhythm.     Heart sounds: Normal heart sounds. No murmur heard.    No friction rub. No gallop.  Pulmonary:     Effort: Pulmonary effort is normal. No respiratory distress.     Breath sounds: Normal breath sounds. No wheezing or rales.  Chest:     Chest wall: No tenderness.  Abdominal:     General: Bowel sounds are normal. There is no distension.     Palpations: Abdomen is soft. There is no mass.     Tenderness: There is no abdominal tenderness. There is no guarding or rebound.  Musculoskeletal:        General: No tenderness. Normal range of motion.     Cervical back: Normal range of motion and neck supple.  Lymphadenopathy:     Cervical: No cervical adenopathy.  Skin:    General: Skin is warm and dry.     Findings: No erythema or rash.  Neurological:     General: No focal deficit present.     Mental Status: She is alert and oriented to person, place, and time.     Cranial Nerves: No cranial nerve deficit.     Motor: No weakness or abnormal muscle tone.     Coordination: Coordination normal.     Gait: Gait normal.  Deep Tendon Reflexes: Reflexes are normal and symmetric. Reflexes normal.  Psychiatric:        Mood and Affect: Mood normal.        Behavior: Behavior normal.        Thought Content: Thought content normal.        Judgment: Judgment normal.           Assessment & Plan:  Her depression and GERD and IBS are stable. Her HTN is well controlled. We will get fasting labs to check lipids, etc. We will refer her to ENT for the hearing loss and the tinnitis. For the headaches, we will set up a head CT asap. We spent a total of ( 35  ) minutes reviewing records and discussing these issues.  Gershon Crane, MD

## 2022-06-25 NOTE — Addendum Note (Signed)
Addended byEncarnacion Slates on: 06/25/2022 02:29 PM   Modules accepted: Orders

## 2022-06-26 DIAGNOSIS — N183 Chronic kidney disease, stage 3 unspecified: Secondary | ICD-10-CM | POA: Insufficient documentation

## 2022-06-26 LAB — CBC WITH DIFFERENTIAL/PLATELET
Basophils Absolute: 0.1 10*3/uL (ref 0.0–0.1)
Basophils Relative: 0.7 % (ref 0.0–3.0)
Eosinophils Absolute: 0.2 10*3/uL (ref 0.0–0.7)
Eosinophils Relative: 2.3 % (ref 0.0–5.0)
HCT: 43.3 % (ref 36.0–46.0)
Hemoglobin: 14.1 g/dL (ref 12.0–15.0)
Lymphocytes Relative: 20.2 % (ref 12.0–46.0)
Lymphs Abs: 1.6 10*3/uL (ref 0.7–4.0)
MCHC: 32.7 g/dL (ref 30.0–36.0)
MCV: 91.3 fl (ref 78.0–100.0)
Monocytes Absolute: 0.7 10*3/uL (ref 0.1–1.0)
Monocytes Relative: 8.7 % (ref 3.0–12.0)
Neutro Abs: 5.4 10*3/uL (ref 1.4–7.7)
Neutrophils Relative %: 68.1 % (ref 43.0–77.0)
Platelets: 301 10*3/uL (ref 150.0–400.0)
RBC: 4.74 Mil/uL (ref 3.87–5.11)
RDW: 15.5 % (ref 11.5–15.5)
WBC: 8 10*3/uL (ref 4.0–10.5)

## 2022-06-26 LAB — BASIC METABOLIC PANEL
BUN: 24 mg/dL — ABNORMAL HIGH (ref 6–23)
CO2: 20 mEq/L (ref 19–32)
Calcium: 9.7 mg/dL (ref 8.4–10.5)
Chloride: 105 mEq/L (ref 96–112)
Creatinine, Ser: 1.54 mg/dL — ABNORMAL HIGH (ref 0.40–1.20)
GFR: 34.66 mL/min — ABNORMAL LOW (ref >60.00)
Glucose, Bld: 88 mg/dL (ref 70–99)
Potassium: 3.8 mEq/L (ref 3.5–5.1)
Sodium: 141 mEq/L (ref 135–145)

## 2022-06-26 LAB — HEPATIC FUNCTION PANEL
ALT: 33 U/L (ref 0–35)
AST: 52 U/L — ABNORMAL HIGH (ref 0–37)
Albumin: 4.4 g/dL (ref 3.5–5.2)
Alkaline Phosphatase: 98 U/L (ref 39–117)
Bilirubin, Direct: 0.2 mg/dL (ref 0.0–0.3)
Total Bilirubin: 0.7 mg/dL (ref 0.2–1.2)
Total Protein: 7.6 g/dL (ref 6.0–8.3)

## 2022-06-26 LAB — URINALYSIS, ROUTINE W REFLEX MICROSCOPIC
Hgb urine dipstick: NEGATIVE
Ketones, ur: NEGATIVE
Nitrite: NEGATIVE
RBC / HPF: NONE SEEN (ref 0–?)
Specific Gravity, Urine: 1.025 (ref 1.000–1.030)
Urine Glucose: NEGATIVE
Urobilinogen, UA: 1 (ref 0.0–1.0)
pH: 6 (ref 5.0–8.0)

## 2022-06-26 LAB — LIPID PANEL
Cholesterol: 230 mg/dL — ABNORMAL HIGH (ref 0–200)
HDL: 47 mg/dL (ref >39.00)
LDL Cholesterol: 155 mg/dL — ABNORMAL HIGH (ref 0–99)
NonHDL: 182.85
Total CHOL/HDL Ratio: 5
Triglycerides: 137 mg/dL (ref 0.0–149.0)
VLDL: 27.4 mg/dL (ref 0.0–40.0)

## 2022-06-26 LAB — VITAMIN D 25 HYDROXY (VIT D DEFICIENCY, FRACTURES): VITD: 27.87 ng/mL — ABNORMAL LOW (ref 30.00–100.00)

## 2022-06-26 LAB — HEMOGLOBIN A1C: Hgb A1c MFr Bld: 5.8 % (ref 4.6–6.5)

## 2022-06-26 LAB — TSH: TSH: 2.8 u[IU]/mL (ref 0.35–5.50)

## 2022-06-26 LAB — VITAMIN B12: Vitamin B-12: 287 pg/mL (ref 211–911)

## 2022-06-26 NOTE — Addendum Note (Signed)
Addended by: Alysia Penna A on: 06/26/2022 04:46 PM   Modules accepted: Orders

## 2022-06-27 ENCOUNTER — Telehealth: Payer: Self-pay | Admitting: Family Medicine

## 2022-06-27 NOTE — Telephone Encounter (Signed)
Pt called, returning CMA's call. CMA was unavailable. Pt asked that CMA call back at his earliest convenience. 

## 2022-06-28 NOTE — Telephone Encounter (Signed)
Patient called back for lab results

## 2022-06-28 NOTE — Telephone Encounter (Signed)
Spoke with pt regarding labs and instructions.   

## 2022-06-28 NOTE — Telephone Encounter (Signed)
LVM for pt to CB regarding results.  

## 2022-07-05 ENCOUNTER — Other Ambulatory Visit: Payer: Self-pay

## 2022-07-05 ENCOUNTER — Telehealth: Payer: Self-pay | Admitting: Family Medicine

## 2022-07-05 DIAGNOSIS — E785 Hyperlipidemia, unspecified: Secondary | ICD-10-CM

## 2022-07-05 MED ORDER — ATORVASTATIN CALCIUM 10 MG PO TABS: 10.0000 mg | ORAL_TABLET | Freq: Every day | ORAL | 3 refills | Status: DC

## 2022-07-05 MED ORDER — CIPROFLOXACIN HCL 500 MG PO TABS: 500.0000 mg | ORAL_TABLET | Freq: Two times a day (BID) | ORAL | 0 refills | Status: AC

## 2022-07-05 NOTE — Telephone Encounter (Signed)
She could have a UTI. Call in Cipro 500 mg BID for 7 days

## 2022-07-05 NOTE — Telephone Encounter (Signed)
Called patient to informed, Cipro 500 mg was sent to [facility] on [address].

## 2022-07-05 NOTE — Telephone Encounter (Signed)
Spoke with patient concerning 06/25/22 lab results.     Patient has concerning with her Urinalysis she is still having burning and stinging symptoms, each time she urinates also, when she goes to urinate at times it's only a small amount even though it feels like she have to urinate a lot.     Please advise.

## 2022-07-05 NOTE — Telephone Encounter (Signed)
Pt is calling and would like some one to call her concerning her blood work results etc

## 2022-07-09 ENCOUNTER — Ambulatory Visit
Admission: RE | Admit: 2022-07-09 | Discharge: 2022-07-09 | Disposition: A | Discharge status: Home / Self Care | Payer: Medicare Other | Source: Ambulatory Visit | Attending: Family Medicine | Admitting: Family Medicine

## 2022-07-09 DIAGNOSIS — R519 Headache, unspecified: Secondary | ICD-10-CM

## 2022-08-08 ENCOUNTER — Telehealth: Payer: Self-pay | Admitting: Family Medicine

## 2022-08-08 NOTE — Telephone Encounter (Signed)
Called patient to schedule Medicare Annual Wellness Visit (AWV). Left message for patient to call back and schedule Medicare Annual Wellness Visit (AWV).  Last date of AWV:  due 03/17/2022 awvi per [facility]  Please schedule an appointment at any time with [facility] beverly or hannah kim.  If any questions, please contact me at [phone].  Thank you ,  Bark[facility] Boards AWV direct phone # [phone]

## 2022-08-09 ENCOUNTER — Telehealth: Payer: Self-pay | Admitting: Family Medicine

## 2022-08-09 NOTE — Telephone Encounter (Signed)
Contacted Wilson Singer to schedule their annual wellness visit. Appointment made for 08/22/22.  [facility]ark[facility] Boards AWV direct phone # [phone]

## 2022-08-13 ENCOUNTER — Encounter: Payer: Self-pay | Admitting: Family Medicine

## 2022-08-13 ENCOUNTER — Ambulatory Visit (INDEPENDENT_AMBULATORY_CARE_PROVIDER_SITE_OTHER): Payer: Medicare Other | Admitting: Family Medicine

## 2022-08-13 VITALS — BP 120/78 | HR 82 | Temp 98.1°F | Wt 236.0 lb

## 2022-08-13 DIAGNOSIS — M25562 Pain in left knee: Secondary | ICD-10-CM | POA: Diagnosis not present

## 2022-08-13 DIAGNOSIS — M25561 Pain in right knee: Secondary | ICD-10-CM | POA: Diagnosis not present

## 2022-08-13 NOTE — Progress Notes (Signed)
   Subjective:    Patient ID: Janice Bright, female    DOB: December 23, 1955, 67 y.o.   MRN: [id number]  HPI Here for injuries to both knees. About 3 weeks ago while she was cleaning the walls in her shower, she turned to the side and felt a sudden severe pain in the left knee. It immediately swelled up, though some of the swelling has come down. She has a lot of pain in the medial and posterior knee areas. She is wearing an elastic support sleeve which helps a little. Then 2 days later while walking across her kitchen the knee went out from under her, causing her to fall. She landed directly on both knees. Now the left knee is still quite painful, and she also has milder pain in the right knee as well. No swelling in the right knee.    Review of Systems  Constitutional: Negative.   Respiratory: Negative.    Cardiovascular: Negative.   Musculoskeletal:  Positive for arthralgias.       Objective:   Physical Exam Constitutional:      Appearance: Normal appearance.     Comments: In pain when she walks   Cardiovascular:     Rate and Rhythm: Normal rate and regular rhythm.     Pulses: Normal pulses.     Heart sounds: Normal heart sounds.  Pulmonary:     Effort: Pulmonary effort is normal.     Breath sounds: Normal breath sounds.  Musculoskeletal:     Comments: The left knee is swollen and very tender along the medial joint space. Extension is full but flexion is limited by pain. The right knee has no swelling and ROM is full. This knee is mildly tender just inferior to the patella   Neurological:     Mental Status: She is alert.           Assessment & Plan:  She has what is likely a meniscal tear in the left knee and a contusion injury to the right knee. We will refer her to Orthopedics to evaluate both knees.  Alysia Penna, MD

## 2022-08-20 ENCOUNTER — Telehealth: Payer: Self-pay | Admitting: Family Medicine

## 2022-08-20 NOTE — Telephone Encounter (Signed)
Requesting meds for gout in right big toe, declined OV

## 2022-08-21 NOTE — Telephone Encounter (Signed)
Patient calling to check on progress of this request

## 2022-08-22 ENCOUNTER — Telehealth (INDEPENDENT_AMBULATORY_CARE_PROVIDER_SITE_OTHER): Payer: Medicare Other | Admitting: Family Medicine

## 2022-08-22 DIAGNOSIS — Z0000 Encounter for general adult medical examination without abnormal findings: Secondary | ICD-10-CM | POA: Diagnosis not present

## 2022-08-22 MED ORDER — METHYLPREDNISOLONE 4 MG PO TBPK: ORAL_TABLET | ORAL | 0 refills | Status: AC

## 2022-08-22 NOTE — Progress Notes (Signed)
PATIENT CHECK-IN and HEALTH RISK ASSESSMENT QUESTIONNAIRE:  -completed by phone/video for upcoming Medicare Preventive Visit  Pre-Visit Check-in: 1)Vitals (height, wt, BP, etc) - record in vitals section for visit on day of visit 2)Review and Update Medications, Allergies PMH, Surgeries, Social history in Epic 3)Hospitalizations in the last year with date/reason? No  4)Review and Update Care Team (patient's specialists) in Epic 5) Complete PHQ9 in Epic  6) Complete Fall Screening in Epic 7)Review all Health Maintenance Due and order under PCP if not done.  8)Medicare Wellness Questionnaire: Answer theses question about your habits: Do you drink alcohol? No  If yes, how many drinks do you have a day?No Have you ever smoked?No  Quit date if applicable?  never smoked How many packs a day do/did you smoke? N/A Do you use smokeless tobacco?No Do you use an illicit drugs?No Do you exercises? Yes  IF so, what type and how many days/minutes per week? Walk, few days per week for 30 mins Are you sexually active? No Number of partners? N/a Reports has been doing much better with eating fruits and veggies Typical breakfast: do not eat breakfast Typical lunch: eggs, toast Typical dinner: pizza, pasta, whatever patient cooks  Typical snacks:fruit, smoothie  Beverages: juice, water, soda, coffee  Answer theses question about you: Can you perform most household chores?Yes  Do you find it hard to follow a conversation in a noisy room?No Do you often ask people to speak up or repeat themselves?No Do you feel that you have a problem with memory?No  Do you balance your checkbook and or bank acounts?Yes  Do you feel safe at home?Yes Last dentist visit?1 year ago Do you need assistance with any of the following: Please note if so No assistance needed with below  Driving?  Feeding yourself?  Getting from bed to chair?  Getting to the toilet?  Bathing or showering?  Dressing yourself?  Managing  money?  Climbing a flight of stairs  Preparing meals?  Do you have Advanced Directives in place (Living Will, Healthcare Power or Attorney)? Yes   Last eye Exam and location?2023, Dr. Sherral Hammers in [place] on [address]   Do you currently use prescribed or non-prescribed narcotic or opioid pain medications?Yes, tylenol, and advil  Do you have a history or close family history of breast, ovarian, tubal or peritoneal cancer or a family member with BRCA (breast cancer susceptibility 1 and 2) gene mutations?  Nurse/Assistant Credentials/time stamp:   ----------------------------------------------------------------------------------------------------------------------------------------------------------------------------------------------------------------------   MEDICARE ANNUAL PREVENTIVE VISIT WITH PROVIDER: (Welcome to [employer], initial annual wellness or annual wellness exam)  Virtual Visit via Video Note  I connected with Janice Bright on 08/22/22 by a video enabled telemedicine application and verified that I am speaking with the correct person using two identifiers.  Location patient: home Location provider:work or home office Persons participating in the virtual visit: patient, provider  Concerns and/or follow up today: Has gout - but otherwise fine - reports PCP sent in medication today. Seeing ortho in a few days for knee issues - fell recently.    See HM section in Epic for other details of completed HM.    ROS: negative for report of fevers, unintentional weight loss, vision changes, vision loss, hearing loss or change, chest pain, sob, hemoptysis, melena, hematochezia, hematuria, genital discharge or lesions, falls, bleeding or bruising, loc, thoughts of suicide or self harm, memory loss  Patient-completed extensive health risk assessment - reviewed and discussed with the patient: See Health Risk Assessment  completed with patient prior to the visit either  above or in recent phone note. This was reviewed in detailed with the patient today and appropriate recommendations, orders and referrals were placed as needed per Summary below and patient instructions.   Review of Medical History: -PMH, PSH, Family History and current specialty and care providers reviewed and updated and listed below   Patient Care Team: Laurey Morale, MD as PCP - General   Past Medical History:  Diagnosis Date   Anxiety    Colitis    sees Dr. Deatra Ina   Compression, spinal, spondylogenic, cervical    Depression    GERD (gastroesophageal reflux disease)    Gynecological examination    sees Dr. Thamas Jaegers   Hypertension    Normal eye exam    sees Dr. Ricki Miller    Normal spontaneous vaginal delivery    3   Sarcoidosis    withlung involvement, sees Dr. Clayburn Pert    Past Surgical History:  Procedure Laterality Date   benign cyst removed from the right breast  2009   COLONOSCOPY  02/22/2021   per Dr. Loletha Carrow, diverticulosis only, repeat in 73 yrs   OOPHORECTOMY     TOTAL ABDOMINAL HYSTERECTOMY  BSO    Social History   Socioeconomic History   Marital status: Married    Spouse name: Not on file   Number of children: Not on file   Years of education: Not on file   Highest education level: Not on file  Occupational History   Not on file  Tobacco Use   Smoking status: Never   Smokeless tobacco: Never  Vaping Use   Vaping Use: Never used  Substance and Sexual Activity   Alcohol use: Yes    Alcohol/week: 2.0 standard drinks of alcohol    Types: 2 Standard drinks or equivalent per week    Comment: occ   Drug use: No   Sexual activity: Not Currently  Other Topics Concern   Not on file  Social History Narrative   Not on file   Social Determinants of Health   Financial Resource Strain: Not on file  Food Insecurity: Not on file  Transportation Needs: Not on file  Physical Activity: Not on file  Stress: Not on file  Social Connections: Not on file   Intimate Partner Violence: Not on file    Family History  Problem Relation Age of Onset   Heart disease Mother    Diabetes Father    Hypertension Other    Cancer Other        colon   Colon cancer Neg Hx    Colon polyps Neg Hx    Esophageal cancer Neg Hx    Rectal cancer Neg Hx    Stomach cancer Neg Hx     Current Outpatient Medications on File Prior to Visit  Medication Sig Dispense Refill   amLODipine (NORVASC) 5 MG tablet Take 1 tablet (5 mg total) by mouth daily. 90 tablet 3   atorvastatin (LIPITOR) 10 MG tablet Take 1 tablet (10 mg total) by mouth daily. 90 tablet 3   diazepam (VALIUM) 5 MG tablet Take 1 tablet (5 mg total) by mouth every 12 (twelve) hours as needed for anxiety. 30 tablet 0   escitalopram (LEXAPRO) 20 MG tablet Take 1 tablet (20 mg total) by mouth daily. 90 tablet 3   fluticasone (FLONASE) 50 MCG/ACT nasal spray Place into both nostrils daily.     losartan (COZAAR) 100 MG tablet Take 1 tablet (100  mg total) by mouth daily. 90 tablet 3   methylPREDNISolone (MEDROL DOSEPAK) 4 MG TBPK tablet As directed 21 tablet 0   montelukast (SINGULAIR) 10 MG tablet Take 1 tablet (10 mg total) by mouth daily. 90 tablet 3   Omega-3 Fatty Acids (PRO NUTRIENTS OMEGA 3 PO) Take 200 mg by mouth daily.     Vitamin D, Ergocalciferol, (DRISDOL) 1.25 MG (50000 UNIT) CAPS capsule Take 1 capsule (50,000 Units total) by mouth once a week. 12 capsule 3   No current facility-administered medications on file prior to visit.    No Known Allergies     Physical Exam There were no vitals filed for this visit. Estimated body mass index is 41.15 kg/m as calculated from the following:   Height as of 06/25/22: 5' 3.5" (1.613 m).   Weight as of 08/13/22: 236 lb (107 kg).  EKG (optional): deferred due to virtual visit  GENERAL: alert, oriented, no acute distress detected, full vision exam deferred due to pandemic and/or virtual encounter   HEENT: atraumatic, conjunttiva clear, no obvious  abnormalities on inspection of external nose and ears  NECK: normal movements of the head and neck  LUNGS: on inspection no signs of respiratory distress, breathing rate appears normal, no obvious gross SOB, gasping or wheezing  CV: no obvious cyanosis  MS: moves all visible extremities without noticeable abnormality  PSYCH/NEURO: pleasant and cooperative, no obvious depression or anxiety, speech and thought processing grossly intact, Cognitive function grossly intact  Flowsheet Row Video Visit from 08/22/2022 in [facility] [place]  PHQ-9 Total Score 1           08/22/2022   12:46 PM 06/25/2022    1:11 PM 12/10/2021   10:26 AM  Depression screen PHQ 2/9  Decreased Interest 0 1 0  Down, Depressed, Hopeless 0 1 0  PHQ - 2 Score 0 2 0  Altered sleeping 0 3   Tired, decreased energy 1 2   Change in appetite 0 2   Feeling bad or failure about yourself  0 2   Trouble concentrating 0 0   Moving slowly or fidgety/restless 0 0   Suicidal thoughts 0 0   PHQ-9 Score 1 11        12/10/2021   10:26 AM 06/25/2022    1:11 PM 08/13/2022   11:08 AM 08/22/2022   12:44 PM  Fall Risk  Falls in the past year? 0 0 1 1  Was there an injury with Fall? 0 0 1 1  Was there an injury with Fall? - Comments   knee injury injured bilateral knees appt on 08/26/22  Fall Risk Category Calculator 0 0 2 2  Fall Risk Category (Retired) Low Low    (RETIRED) Patient Fall Risk Level Low fall risk Low fall risk    Patient at Risk for Falls Due to No Fall Risks No Fall Risks Other (Comment)   Fall risk Follow up Falls evaluation completed Falls evaluation completed Falls evaluation completed;Follow up appointment;Falls prevention discussed      SUMMARY AND PLAN:  Encounter for Medicare annual wellness exam     Discussed applicable health maintenance/preventive health measures and advised and referred or ordered per patient preferences:  Health Maintenance  Topic Date Due    Medicare Annual Wellness (AWV)  Now 08/22/2023   Hepatitis C Screening  Never done, discussed, advised can request with labs if wishes to do   DTaP/Tdap/Td (1 - Tdap) Never done, advised and discussed, can  get at pharmacy or office   Zoster Vaccines- Shingrix (1 of 2) Never done, discussed, advised can get at pharmacy   COVID-19 Vaccine (3 - Moderna risk series) 09/25/2019   MAMMOGRAM  04/13/2022, she reports she will call to schedule herself at [facility] (Pts 45-88yr Insurance coverage will need to be confirmed)  02/23/2031   Pneumonia Vaccine 67 Years old  Completed   INFLUENZA VACCINE  Completed   DEXA SCAN  Completed, due, advised [facility]   HPV VACCINES  Aged OI[employer]/InterActiveCorpand counseling on the following was provided based on the above review of health and a plan/checklist for the patient, along with additional information discussed, was provided for the patient in the patient instructions :   -Provided counseling and plan for increased risk of falling if applicable per above screening. (Referral for PT, community based exercise programs, etc.) She is seeing specialist in a few days. Did go over safe home balance exercises an dhow to do safely.  -Provided counseling and plan for function difficulties/ difficulties with ADLs if applicable per above screening. -Advised and counseled on maintaining healthy weight and healthy lifestyle - including the importance of a healthy diet, regular physical activity, social connections and stress management. -Advised and counseled on a whole foods based healthy diet and regular exercise: discussed a heart healthy whole foods based diet at length. A summary of a healthy diet was provided in the Patient Instructions. -Recommended regular exercise and discussed exercise guidelines, precautions and options for at home and within the community.  -Advise yearly dental visits at minimum and regular eye  exams   Follow up: see patient instructions     Patient Instructions  I really enjoyed getting to talk with you today! I am available on Tuesdays and Thursdays for virtual visits if you have any questions or concerns, or if I can be of any further assistance.   CHECKLIST FROM ANNUAL WELLNESS VISIT:  -Follow up (please call to schedule if not scheduled after visit):   -yearly for annual wellness visit with primary care office  Here is a list of your preventive care/health maintenance measures and the plan for each if any are due:  Health Maintenance  Topic Date Due   Hepatitis C Screening  Never done, can request when you get labs   DTaP/Tdap/Td (1 - Tdap) Never done, can get at pharmacy or at the office   Zoster Vaccines- Shingrix (1 of 2) Never done, can get at the pharmacy   COVID-19 Vaccine (3 - Moderna risk series) 09/25/2019, due yearly, can get at the pharmacy   MAMMOGRAM  04/13/2022 - Please call TODAY to schedule, if any issues with scheduling please let uKoreaknow so that we can help.    Medicare Annual Wellness (AWV)  08/22/2023   COLONOSCOPY (Pts 45-475yrInsurance coverage will need to be confirmed)  02/23/2031   Pneumonia Vaccine [age]Years old  Completed   INFLUENZA VACCINE  Completed   DEXA SCAN  Due, please call to schedule and let usKoreanow if any issues with scheduling.    HPV VACCINES  Aged Out    -See a dentist at least yearly  -Get your eyes checked and then per your eye specialist's recommendations  -Other issues addressed today:   -I have included below further information regarding a healthy whole foods based diet, physical activity guidelines for adults, stress management and opportunities for social  connections. I hope you find this information useful.    -----------------------------------------------------------------------------------------------------------------------------------------------------------------------------------------------------------------------------------------------------------  NUTRITION: -eat real food: lots of colorful vegetables (half the plate) and fruits (tart cherries and veggies and fruits are good when you have gout - except for avoid spinach and asparagus) -5-7 servings of vegetables and fruits per day (fresh or steamed is best), exp. 2 servings of vegetables with lunch and dinner and 2 servings of fruit per day. Berries and greens such as kale and collards are great choices.  -consume on a regular basis: whole grains (make sure first ingredient on label contains the word "whole"), fresh fruits, fish, nuts, seeds, healthy oils (such as olive oil, avocado oil, grape seed oil) -may eat small amounts of dairy and lean meat on occasion, but avoid processed meats such as ham, bacon, lunch meat, etc. -drink water -try to avoid fast food and pre-packaged foods, processed meat -most experts advise limiting sodium to < '2300mg'$  per day, should limit further is any chronic conditions such as high blood pressure, heart disease, diabetes, etc. The American Heart Association advised that < '1500mg'$  is is ideal -try to avoid foods that contain any ingredients with names you do not recognize  -try to avoid sugar/sweets (except for the natural sugar that occurs in fresh fruit) -try to avoid sweet drinks -try to avoid white rice, white bread, pasta (unless whole grain), white or yellow potatoes  EXERCISE GUIDELINES FOR ADULTS: -if you wish to increase your physical activity, do so gradually and with the approval of your doctor -STOP and seek medical care immediately if you have any chest pain, chest discomfort or trouble breathing when starting or increasing exercise  -move and stretch your body, legs, feet and arms when sitting  for long periods -Physical activity guidelines for optimal health in adults: -least 150 minutes per week of aerobic exercise (can talk, but not sing) once approved by your doctor, 20-30 minutes of sustained activity or two 10 minute episodes of sustained activity every day.  -resistance training at least 2 days per week if approved by your doctor -balance exercises 3+ days per week:   Stand somewhere where you have something sturdy to hold onto if you lose balance.    1) lift up on toes, start with 5x per day and work up to 20x   2) stand and lift on leg straight out to the side so that foot is a few inches of the floor, start with 5x each side and work up to 20x each side   3) stand on one foot, start with 5 seconds each side and work up to 20 seconds on each side  If you need ideas or help with getting more active:  -Silver sneakers https://tools.silversneakers.com  -Walk with a Doc: http://stephens-thompson.biz/  -try to include resistance (weight lifting/strength building) and balance exercises twice per week: or the following link for ideas: ChessContest.fr  UpdateClothing.com.cy  STRESS MANAGEMENT: -can try meditating, or just sitting quietly with deep breathing while intentionally relaxing all parts of your body for 5 minutes daily -if you need further help with stress, anxiety or depression please follow up with your primary doctor or contact the wonderful folks at [place]: [place]: -options in [place] if you wish to engage in more social and exercise related activities:  -Silver sneakers https://tools.silversneakers.com  -Walk with a Doc: http://stephens-thompson.biz/  -Check out the [place] 50+ section on the [employer] (hiking clubs, book clubs, cards and games, chess, exercise  classes, aquatic classes and much  more) - see the website for details: https://www.[place].gov/departments/parks-recreation/active-adults50  -YouTube has lots of exercise videos for different ages and abilities as well  -[employer] (a variety of indoor and outdoor inperson activities for adults). 270-082-9562. [address]  -Virtual Online Classes (a variety of topics): see seniorplanet.org or call 930-882-8840  -consider volunteering at a school, hospice center, church, senior center or elsewhere           Lucretia Kern, DO

## 2022-08-22 NOTE — Telephone Encounter (Signed)
I sent in a Medrol dose pack (steroids)

## 2022-08-22 NOTE — Telephone Encounter (Signed)
Left detailed message for pt with Dr Sarajane Jews advise

## 2022-08-22 NOTE — Addendum Note (Signed)
Addended by: Alysia Penna A on: 08/22/2022 08:30 AM   Modules accepted: Orders

## 2022-08-22 NOTE — Patient Instructions (Addendum)
I really enjoyed getting to talk with you today! I am available on Tuesdays and Thursdays for virtual visits if you have any questions or concerns, or if I can be of any further assistance.   CHECKLIST FROM ANNUAL WELLNESS VISIT:  -Follow up (please call to schedule if not scheduled after visit):   -yearly for annual wellness visit with primary care office  Here is a list of your preventive care/health maintenance measures and the plan for each if any are due:  Health Maintenance  Topic Date Due   Hepatitis C Screening  Never done, can request when you get labs   DTaP/Tdap/Td (1 - Tdap) Never done, can get at pharmacy or at the office   Zoster Vaccines- Shingrix (1 of 2) Never done, can get at the pharmacy   COVID-19 Vaccine (3 - Moderna risk series) 09/25/2019, due yearly, can get at the pharmacy   MAMMOGRAM  04/13/2022 - Please call TODAY to schedule, if any issues with scheduling please let us know so that we can help.    Medicare Annual Wellness (AWV)  08/22/2023   COLONOSCOPY (Pts 45-61yr Insurance coverage will need to be confirmed)  02/23/2031   Pneumonia Vaccine 68 Years old  Completed   INFLUENZA VACCINE  Completed   DEXA SCAN  Due, please call to schedule and let uKoreaknow if any issues with scheduling.    HPV VACCINES  Aged Out    -See a dentist at least yearly  -Get your eyes checked and then per your eye specialist's recommendations  -Other issues addressed today:   -I have included below further information regarding a healthy whole foods based diet, physical activity guidelines for adults, stress management and opportunities for social connections. I hope you find this information useful.   -----------------------------------------------------------------------------------------------------------------------------------------------------------------------------------------------------------------------------------------------------------  NUTRITION: -eat real food:  lots of colorful vegetables (half the plate) and fruits (tart cherries and veggies and fruits are good when you have gout - except for avoid spinach and asparagus) -5-7 servings of vegetables and fruits per day (fresh or steamed is best), exp. 2 servings of vegetables with lunch and dinner and 2 servings of fruit per day. Berries and greens such as kale and collards are great choices.  -consume on a regular basis: whole grains (make sure first ingredient on label contains the word "whole"), fresh fruits, fish, nuts, seeds, healthy oils (such as olive oil, avocado oil, grape seed oil) -may eat small amounts of dairy and lean meat on occasion, but avoid processed meats such as ham, bacon, lunch meat, etc. -drink water -try to avoid fast food and pre-packaged foods, processed meat -most experts advise limiting sodium to < '2300mg'$  per day, should limit further is any chronic conditions such as high blood pressure, heart disease, diabetes, etc. The American Heart Association advised that < '1500mg'$  is is ideal -try to avoid foods that contain any ingredients with names you do not recognize  -try to avoid sugar/sweets (except for the natural sugar that occurs in fresh fruit) -try to avoid sweet drinks -try to avoid white rice, white bread, pasta (unless whole grain), white or yellow potatoes  EXERCISE GUIDELINES FOR ADULTS: -if you wish to increase your physical activity, do so gradually and with the approval of your doctor -STOP and seek medical care immediately if you have any chest pain, chest discomfort or trouble breathing when starting or increasing exercise  -move and stretch your body, legs, feet and arms when sitting for long periods -Physical activity guidelines for optimal  health in adults: -least 150 minutes per week of aerobic exercise (can talk, but not sing) once approved by your doctor, 20-30 minutes of sustained activity or two 10 minute episodes of sustained activity every day.   -resistance training at least 2 days per week if approved by your doctor -balance exercises 3+ days per week:   Stand somewhere where you have something sturdy to hold onto if you lose balance.    1) lift up on toes, start with 5x per day and work up to 20x   2) stand and lift on leg straight out to the side so that foot is a few inches of the floor, start with 5x each side and work up to 20x each side   3) stand on one foot, start with 5 seconds each side and work up to 20 seconds on each side  If you need ideas or help with getting more active:  -Silver sneakers https://tools.silversneakers.com  -Walk with a Doc: http://stephens-thompson.biz/  -try to include resistance (weight lifting/strength building) and balance exercises twice per week: or the following link for ideas: ChessContest.fr  UpdateClothing.com.cy  STRESS MANAGEMENT: -can try meditating, or just sitting quietly with deep breathing while intentionally relaxing all parts of your body for 5 minutes daily -if you need further help with stress, anxiety or depression please follow up with your primary doctor or contact the wonderful folks at [place]: [place]: -options in [place] if you wish to engage in more social and exercise related activities:  -Silver sneakers https://tools.silversneakers.com  -Walk with a Doc: http://stephens-thompson.biz/  -Check out the [place] 50+ section on the [place] [employer] (hiking clubs, book clubs, cards and games, chess, exercise classes, aquatic classes and much more) - see the website for details: https://www.[place].gov/departments/parks-recreation/active-adults50  -YouTube has lots of exercise videos for different ages and abilities as well  -[place] (a variety of indoor and outdoor inperson activities  for adults). [phone]. [address]  -Virtual Online Classes (a variety of topics): see seniorplanet.org or call [phone]  -consider volunteering at a school, hospice center, church, senior center or elsewhere

## 2022-08-23 NOTE — Telephone Encounter (Signed)
Spoke with pt state that she picked up Rx for [facility] Dose pack and she is taking. Nothing further needed

## 2022-09-11 LAB — LAB REPORT - SCANNED: EGFR: 39

## 2022-09-12 ENCOUNTER — Other Ambulatory Visit: Payer: Self-pay | Admitting: Nephrology

## 2022-09-12 DIAGNOSIS — N1832 Chronic kidney disease, stage 3b: Secondary | ICD-10-CM

## 2022-11-05 ENCOUNTER — Ambulatory Visit
Admission: RE | Admit: 2022-11-05 | Discharge: 2022-11-05 | Disposition: A | Discharge status: Home / Self Care | Payer: Medicare Other | Source: Ambulatory Visit | Attending: Nephrology | Admitting: Nephrology

## 2022-11-05 DIAGNOSIS — N1832 Chronic kidney disease, stage 3b: Secondary | ICD-10-CM

## 2022-12-25 ENCOUNTER — Telehealth: Payer: Self-pay | Admitting: Family Medicine

## 2022-12-25 NOTE — Telephone Encounter (Signed)
Pt called in and stated she is needing a referral to a gynecologist for an updated pap smear. She would like a call back to confirm the referral was placed.

## 2022-12-25 NOTE — Telephone Encounter (Signed)
Prescription Request  12/25/2022  LOV: 08/13/2022  What is the name of the medication or equipment? Losartan.   Have you contacted your pharmacy to request a refill? Yes   Which pharmacy would you like this sent to?  [id number], [place] - [address] [place] [address] Phone: [phone] Fax: [phone]    Patient notified that their request is being sent to the clinical staff for review and that they should receive a response within 2 business days.   Please advise at Mobile [phone] (mobile)

## 2022-12-27 ENCOUNTER — Other Ambulatory Visit: Payer: Self-pay

## 2022-12-27 MED ORDER — LOSARTAN POTASSIUM 100 MG PO TABS: 100.0000 mg | ORAL_TABLET | Freq: Every day | ORAL | 3 refills | Status: DC

## 2022-12-27 NOTE — Telephone Encounter (Signed)
Left pt a message with a GYN office number, Advised pt to call and schedule appointment

## 2022-12-27 NOTE — Telephone Encounter (Signed)
Pt Rx sent 

## 2023-07-19 ENCOUNTER — Other Ambulatory Visit: Payer: Self-pay | Admitting: Family Medicine

## 2023-07-19 DIAGNOSIS — E785 Hyperlipidemia, unspecified: Secondary | ICD-10-CM

## 2023-07-22 ENCOUNTER — Other Ambulatory Visit: Payer: Self-pay | Admitting: Family Medicine

## 2023-09-10 ENCOUNTER — Other Ambulatory Visit: Payer: Self-pay | Admitting: Family Medicine

## 2023-09-12 ENCOUNTER — Other Ambulatory Visit: Payer: Self-pay | Admitting: Family Medicine

## 2023-10-08 ENCOUNTER — Other Ambulatory Visit: Payer: Self-pay | Admitting: Family Medicine

## 2023-11-15 ENCOUNTER — Other Ambulatory Visit: Payer: Self-pay | Admitting: Family Medicine

## 2023-11-18 ENCOUNTER — Encounter: Admitting: Obstetrics and Gynecology

## 2024-01-16 LAB — HM MAMMOGRAPHY

## 2024-01-26 ENCOUNTER — Ambulatory Visit: Admitting: Obstetrics and Gynecology

## 2024-01-26 ENCOUNTER — Encounter: Payer: Self-pay | Admitting: Obstetrics and Gynecology

## 2024-01-26 ENCOUNTER — Other Ambulatory Visit (HOSPITAL_COMMUNITY)
Admission: RE | Admit: 2024-01-26 | Discharge: 2024-01-26 | Disposition: A | Discharge status: Home / Self Care | Source: Ambulatory Visit | Attending: Obstetrics and Gynecology | Admitting: Obstetrics and Gynecology

## 2024-01-26 VITALS — BP 145/83 | HR 86 | Wt 235.0 lb

## 2024-01-26 DIAGNOSIS — N9489 Other specified conditions associated with female genital organs and menstrual cycle: Secondary | ICD-10-CM | POA: Insufficient documentation

## 2024-01-26 DIAGNOSIS — Z1331 Encounter for screening for depression: Secondary | ICD-10-CM | POA: Diagnosis not present

## 2024-01-26 DIAGNOSIS — L292 Pruritus vulvae: Secondary | ICD-10-CM | POA: Insufficient documentation

## 2024-01-26 LAB — POCT URINALYSIS DIPSTICK
Blood, UA: NEGATIVE
Glucose, UA: NEGATIVE
Ketones, UA: NEGATIVE
Leukocytes, UA: NEGATIVE
Nitrite, UA: NEGATIVE
Protein, UA: POSITIVE — AB
Spec Grav, UA: >=1.03 — AB (ref 1.010–1.025)
Urobilinogen, UA: 0.2 U/dL
pH, UA: 6 (ref 5.0–8.0)

## 2024-01-26 NOTE — Progress Notes (Unsigned)
 New GYN here for problem visit today.  Last pap 8-9 yrs ago pt asked if she needed one today. No hx of abnormal paps.  CC: Vaginal itching and burning x 1 month now. Also notes dysuria.

## 2024-01-26 NOTE — Progress Notes (Unsigned)
 Obstetrics and Gynecology New Patient Evaluation  Appointment Date: 01/26/2024  OBGYN Clinic: [facility]   Primary Care Provider: Johnny Senior A  Chief Complaint:  Chief Complaint  Patient presents with   Vaginitis   Dysuria    History of Present Illness: Janice Bright is a 68 y.o. [phone] (LMP: h/o TAH/BSO for fibroids), seen for the above chief complaint.  Vaginal itching for the past month and ?dysuria. No lower urinary tract s/s. No VB or spotting.    Review of Systems: Pertinent items are noted in HPI.   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 Debrah   Compression, spinal, spondylogenic, cervical    Depression    GERD (gastroesophageal reflux disease)    Gynecological examination    sees Dr. Sung   Hypertension    Normal eye exam    sees Dr. Sharalyn    Normal spontaneous vaginal delivery    3   Sarcoidosis    withlung involvement, sees Dr. Mozelle   Past Surgical History:  Past Surgical History:  Procedure Laterality Date   benign cyst removed from the right breast  2009   COLONOSCOPY  02/22/2021   per Dr.  Legrand, diverticulosis only, repeat in 10 yrs   OOPHORECTOMY     TOTAL ABDOMINAL HYSTERECTOMY  BSO   Past Obstetrical History:  OB History  Gravida Para Term Preterm AB Living  3 3 3   3   SAB IAB Ectopic Multiple Live Births     0     # Outcome Date GA Lbr Len/2nd Weight Sex Type Anes PTL Lv  3 Term 04/08/86     Vag-Spont     2 Term 03/04/82          1 Term 02/02/79     Vag-Spont      Past Gynecological History: As per HPI. History of HRT use: in the remote past  Social History:  Social History   Socioeconomic History   Marital status: Married    Spouse name: Not on file   Number of children: Not on file   Years of education: Not on file   Highest education level: Not on file  Occupational History   Not on file  Tobacco Use   Smoking status: Never   Smokeless tobacco: Never  Vaping Use   Vaping status: Never Used  Substance and Sexual Activity   Alcohol use: Yes    Alcohol/week: 2.0 standard drinks of alcohol    Types: 2 Standard drinks or equivalent per week    Comment: occ   Drug use: No   Sexual activity: Not Currently  Other Topics Concern   Not on file  Social History Narrative   Not on file   Social Drivers of Health   Financial Resource Strain: Not on file  Food Insecurity: Not on file  Transportation Needs: Not on file  Physical Activity: Not on file  Stress: Not on file  Social Connections: Not on file  Intimate Partner Violence: Not on file   Family History:  Family History  Problem Relation Age of Onset   Heart disease Mother    Diabetes Father    Hypertension Other    Cancer Other        colon   Colon cancer Neg Hx    Colon polyps Neg Hx    Esophageal cancer Neg Hx    Rectal cancer Neg Hx    Stomach cancer Neg Hx    Health Maintenance:  Mammogram(s): Yes.   Date: 2024 Colonoscopy: Yes.   Date: 2022  Medications Vincent Ehrler. Viktoria Chancy had no medications administered during this visit. Current Outpatient Medications  Medication  Sig Dispense Refill   amLODipine  (NORVASC ) 5 MG tablet TAKE 1 TABLET (5 MG TOTAL) BY MOUTH DAILY. 30 tablet 0   atorvastatin  (LIPITOR) 10 MG tablet TAKE 1 TABLET BY MOUTH EVERY DAY 90 tablet 3   loratadine  (CLARITIN ) 10 MG tablet      losartan  (COZAAR ) 100 MG tablet Take 1 tablet (100 mg total) by mouth daily. 90 tablet 3   montelukast  (SINGULAIR ) 10 MG tablet TAKE 1 TABLET BY MOUTH EVERY DAY 90 tablet 3   Vitamin D , Ergocalciferol , (DRISDOL ) 1.25 MG (50000 UNIT) CAPS capsule TAKE 1 CAPSULE BY MOUTH ONE TIME PER WEEK 12 capsule 3   diazepam  (VALIUM ) 5 MG tablet Take 1 tablet (5 mg total) by mouth every 12 (twelve) hours as needed for anxiety. (Patient not taking: Reported on 01/26/2024) 30 tablet 0   fluticasone (FLONASE) 50 MCG/ACT nasal spray Place into both nostrils daily.     methylPREDNISolone  (MEDROL  DOSEPAK) 4 MG TBPK tablet As directed 21 tablet 0   Omega-3 Fatty Acids (PRO NUTRIENTS OMEGA 3 PO) Take 200 mg by mouth daily. (Patient not taking: Reported on 01/26/2024)     No current facility-administered medications for this visit.   Allergies Patient has no known allergies.  Physical Exam:  BP (!) 145/83   Pulse 86   Wt 235 lb (106.6 kg)   BMI 40.98 kg/m  Body mass index is 40.98 kg/m. General appearance: Well nourished, well developed female in no acute distress.  Respiratory:  Normal respiratory effort Abdomen: nttp, nd Neuro/Psych:  Normal mood and affect.  Skin:  Warm and dry.  Lymphatic:  No inguinal lymphadenopathy.   Cervical exam performed in the presence of a chaperone Pelvic exam: is not limited by body habitus EGBUS: within normal limits, moderate atrophy Vagina: within normal limits and with no blood or discharge in the vault, moderate atrophy Cuff: negative Bimanual: negative  Laboratory: u/a dip + protein  Radiology: none  Assessment: patient stable  Plan:  1. Vaginal burning (Primary) - Cervicovaginal ancillary only( [place]) - POCT Urinalysis  Dipstick - Urinalysis, Routine w reflex microscopic  Orders Placed This Encounter  Procedures   Urinalysis, Routine w reflex microscopic   POCT Urinalysis Dipstick    RTC PRN  Return if symptoms worsen or fail to improve.  No future appointments.  Bebe Izell Raddle MD Attending [facility])

## 2024-01-27 LAB — URINALYSIS, ROUTINE W REFLEX MICROSCOPIC

## 2024-01-28 LAB — CERVICOVAGINAL ANCILLARY ONLY
Bacterial Vaginitis (gardnerella): NEGATIVE
Candida Glabrata: NEGATIVE
Candida Vaginitis: NEGATIVE
Comment: NEGATIVE
Comment: NEGATIVE
Comment: NEGATIVE

## 2024-02-17 ENCOUNTER — Ambulatory Visit: Payer: Self-pay

## 2024-02-17 NOTE — Telephone Encounter (Signed)
 FYI Only or Action Required?: FYI only for provider.  Patient was last seen in primary care on 08/22/2022 by Luke Chiquita SAUNDERS, DO.  Called Nurse Triage reporting Dizziness.  Symptoms began a long time.  Interventions attempted: Nothing.  Symptoms are: gradually worsening.  Triage Disposition: Go to ED Now (or PCP Triage)  Patient/caregiver understands and will follow disposition?: Yes                    Copied from CRM #[id number]. Topic: Clinical - Red Word Triage >> Feb 17, 2024  2:42 PM Berneda FALCON wrote: Red Word that prompted transfer to Nurse Triage: Pt states she is having trouble with dizziness, falling due to being dizzy. States she fainted couple of weeks ago at a show. Reason for Disposition  Patient sounds very sick or weak to the triager  Answer Assessment - Initial Assessment Questions Patient states that she has been dealing with dizziness for a long time and she states she is probably dehydrated because she doesn't drink any water She admits that she drinks alcohol often and that may be a contributing factor She states that she has dizziness even when she isn't drinking She states she would like to go to a facility for her alcohol use She also states that she has ringing in her ears that has been going on a long time and she wants that rechecked.  Patient states that she consumes too much when asked how much  Patient is advised that the recommendation at this time is to go to the Emergency Room for further evaluation Patient is advised that having someone drive her is recommended for safety and she states she doesn't have someone to drive her This RN advised that ambulance transportation is an option and she declined. She is advised again that having someone take her is recommended due to safety She states she will go to the ER and is advised to call us  back with any further questions or concerns. Patient verbalized understanding.     1.  DESCRIPTION: Describe your dizziness.     dizziness 2. LIGHTHEADED: Do you feel lightheaded? (e.g., somewhat faint, woozy, weak upon standing)     dizzy 3. VERTIGO: Do you feel like either you or the room is spinning or tilting? (i.e., vertigo)     no 4. SEVERITY: How bad is it?  Do you feel like you are going to faint? Can you stand and walk?     Patient unsure due to drinking   too much per patient 5. ONSET:  When did the dizziness begin?     It's just gotten worse  it's been going on a while 6. AGGRAVATING FACTORS: Does anything make it worse? (e.g., standing, change in head position)     No per patient 7. HEART RATE: Can you tell me your heart rate? How many beats in 15 seconds?  (Note: Not all patients can do this.)       ----- 8. CAUSE: What do you think is causing the dizziness? (e.g., decreased fluids or food, diarrhea, emotional distress, heat exposure, new medicine, sudden standing, vomiting; unknown)     Patient thinks her drinking may have something to do with it 9. RECURRENT SYMPTOM: Have you had dizziness before? If Yes, ask: When was the last time? What happened that time?     Hasn't been evaluated 10. OTHER SYMPTOMS: Do you have any other symptoms? (e.g., fever, chest pain, vomiting, diarrhea, bleeding)  Ringing in the ears  Protocols used: Dizziness - Lightheadedness-A-AH

## 2024-02-17 NOTE — Telephone Encounter (Signed)
 This RN called [facility] to advise them of patient's symptoms and triage information, along with Emergency Room disposition and patient being advised to have someone drive her to the ER

## 2024-02-18 ENCOUNTER — Emergency Department (HOSPITAL_COMMUNITY)

## 2024-02-18 ENCOUNTER — Emergency Department (HOSPITAL_COMMUNITY)
Admission: EM | Admit: 2024-02-18 | Discharge: 2024-02-18 | Disposition: A | Discharge status: Home / Self Care | Attending: Emergency Medicine | Admitting: Emergency Medicine

## 2024-02-18 ENCOUNTER — Other Ambulatory Visit: Payer: Self-pay

## 2024-02-18 ENCOUNTER — Encounter (HOSPITAL_COMMUNITY): Payer: Self-pay

## 2024-02-18 DIAGNOSIS — Z79899 Other long term (current) drug therapy: Secondary | ICD-10-CM | POA: Insufficient documentation

## 2024-02-18 DIAGNOSIS — R42 Dizziness and giddiness: Secondary | ICD-10-CM | POA: Diagnosis present

## 2024-02-18 DIAGNOSIS — H9313 Tinnitus, bilateral: Secondary | ICD-10-CM | POA: Insufficient documentation

## 2024-02-18 DIAGNOSIS — H6122 Impacted cerumen, left ear: Secondary | ICD-10-CM | POA: Diagnosis not present

## 2024-02-18 DIAGNOSIS — I10 Essential (primary) hypertension: Secondary | ICD-10-CM | POA: Insufficient documentation

## 2024-02-18 LAB — CBC WITH DIFFERENTIAL/PLATELET
Abs Immature Granulocytes: 0.04 K/uL (ref 0.00–0.07)
Basophils Absolute: 0.1 K/uL (ref 0.0–0.1)
Basophils Relative: 1 %
Eosinophils Absolute: 0.2 K/uL (ref 0.0–0.5)
Eosinophils Relative: 2 %
HCT: 43.9 % (ref 36.0–46.0)
Hemoglobin: 14.2 g/dL (ref 12.0–15.0)
Immature Granulocytes: 1 %
Lymphocytes Relative: 15 %
Lymphs Abs: 1.2 K/uL (ref 0.7–4.0)
MCH: 29.5 pg (ref 26.0–34.0)
MCHC: 32.3 g/dL (ref 30.0–36.0)
MCV: 91.1 fL (ref 80.0–100.0)
Monocytes Absolute: 0.7 K/uL (ref 0.1–1.0)
Monocytes Relative: 9 %
Neutro Abs: 6 K/uL (ref 1.7–7.7)
Neutrophils Relative %: 72 %
Platelets: 256 K/uL (ref 150–400)
RBC: 4.82 MIL/uL (ref 3.87–5.11)
RDW: 14 % (ref 11.5–15.5)
WBC: 8.3 K/uL (ref 4.0–10.5)
nRBC: 0 % (ref 0.0–0.2)

## 2024-02-18 LAB — COMPREHENSIVE METABOLIC PANEL WITH GFR
ALT: 22 U/L (ref 0–44)
AST: 35 U/L (ref 15–41)
Albumin: 4 g/dL (ref 3.5–5.0)
Alkaline Phosphatase: 85 U/L (ref 38–126)
Anion gap: 14 (ref 5–15)
BUN: 24 mg/dL — ABNORMAL HIGH (ref 8–23)
CO2: 21 mmol/L — ABNORMAL LOW (ref 22–32)
Calcium: 9.5 mg/dL (ref 8.9–10.3)
Chloride: 106 mmol/L (ref 98–111)
Creatinine, Ser: 1.29 mg/dL — ABNORMAL HIGH (ref 0.44–1.00)
GFR, Estimated: 45 mL/min — ABNORMAL LOW (ref >60)
Glucose, Bld: 122 mg/dL — ABNORMAL HIGH (ref 70–99)
Potassium: 3 mmol/L — ABNORMAL LOW (ref 3.5–5.1)
Sodium: 141 mmol/L (ref 135–145)
Total Bilirubin: 1 mg/dL (ref 0.0–1.2)
Total Protein: 7.2 g/dL (ref 6.5–8.1)

## 2024-02-18 LAB — URINALYSIS, ROUTINE W REFLEX MICROSCOPIC
Glucose, UA: NEGATIVE mg/dL
Hgb urine dipstick: NEGATIVE
Ketones, ur: NEGATIVE mg/dL
Leukocytes,Ua: NEGATIVE
Nitrite: NEGATIVE
Protein, ur: 100 mg/dL — AB
Specific Gravity, Urine: 1.025 (ref 1.005–1.030)
pH: 5 (ref 5.0–8.0)

## 2024-02-18 LAB — I-STAT CHEM 8, ED
BUN: 25 mg/dL — ABNORMAL HIGH (ref 8–23)
Calcium, Ion: 1.15 mmol/L (ref 1.15–1.40)
Chloride: 109 mmol/L (ref 98–111)
Creatinine, Ser: 1.3 mg/dL — ABNORMAL HIGH (ref 0.44–1.00)
Glucose, Bld: 122 mg/dL — ABNORMAL HIGH (ref 70–99)
HCT: 43 % (ref 36.0–46.0)
Hemoglobin: 14.6 g/dL (ref 12.0–15.0)
Potassium: 3.2 mmol/L — ABNORMAL LOW (ref 3.5–5.1)
Sodium: 145 mmol/L (ref 135–145)
TCO2: 24 mmol/L (ref 22–32)

## 2024-02-18 LAB — ETHANOL: Alcohol, Ethyl (B): <15 mg/dL (ref <15)

## 2024-02-18 MED ORDER — GADOBUTROL 1 MMOL/ML IV SOLN
10.0000 mL | Freq: Once | INTRAVENOUS | Status: AC | PRN
  Administered 2024-02-18: 10 mL via INTRAVENOUS

## 2024-02-18 MED ORDER — CARBAMIDE PEROXIDE 6.5 % OT SOLN
5.0000 [drp] | Freq: Once | OTIC | Status: DC
  Filled 2024-02-18: qty 15

## 2024-02-18 NOTE — ED Notes (Signed)
Taken to MRI 

## 2024-02-18 NOTE — ED Provider Notes (Signed)
 [place] EMERGENCY DEPARTMENT AT [facility] Provider Note   CSN: [id number] Arrival date & time: 02/18/24  1113     Patient presents with: Dizziness and Tinnitus   Janice Bright is a 68 y.o. female.   Patient is a 68 year old female with past medical history of hypertension, GERD, sarcoidosis, and depression presenting for concerns of tinnitus.  Patient admits to tinnitus over the past 3 months.  Tinnitus is bilateral but worse on the right side.  She admits to intermittent headaches, feelings like her eyes are rolling to the back of her head, and lightheadedness and dizziness.  Although she states the dizziness is somewhat chronic she states that is becoming more profound.  She admits to having a syncopal episode within the last month due to significant lightheadedness and dizziness.  She denies any active bleeding, hematochezia, or melena.  She denies any recent falls or blunt head trauma in the last 7 days.  She admits to proper food intake but states she does not drink as much water/fluids as she should.  She denies any fevers, chills, nausea, vomiting, diarrhea, coughing, dysuria, or infectious signs or symptoms at this time.  Denies chest pain or shortness of breath.  The history is provided by the patient. No language interpreter was used.  Dizziness Associated symptoms: tinnitus   Associated symptoms: no chest pain, no palpitations, no shortness of breath and no vomiting        Prior to Admission medications   Medication Sig Start Date End Date Taking? Authorizing Provider  amLODipine  (NORVASC ) 5 MG tablet TAKE 1 TABLET (5 MG TOTAL) BY MOUTH DAILY. 11/17/23   Johnny Garnette LABOR, MD  atorvastatin  (LIPITOR) 10 MG tablet TAKE 1 TABLET BY MOUTH EVERY DAY 07/22/23   Johnny Garnette LABOR, MD  diazepam  (VALIUM ) 5 MG tablet Take 1 tablet (5 mg total) by mouth every 12 (twelve) hours as needed for anxiety. Patient not taking: Reported on 01/26/2024 06/25/22   Johnny Garnette LABOR, MD   fluticasone [facility]) 50 MCG/ACT nasal spray Place into both nostrils daily.    [provider]  loratadine  (CLARITIN ) 10 MG tablet  08/16/23   [provider]  losartan  (COZAAR ) 100 MG tablet Take 1 tablet (100 mg total) by mouth daily. 12/27/22   Johnny Garnette LABOR, MD  methylPREDNISolone  (MEDROL  DOSEPAK) 4 MG TBPK tablet As directed 08/22/22   Johnny Garnette LABOR, MD  montelukast  (SINGULAIR ) 10 MG tablet TAKE 1 TABLET BY MOUTH EVERY DAY 07/22/23   Johnny Garnette LABOR, MD  Omega-3 Fatty Acids (PRO NUTRIENTS OMEGA 3 PO) Take 200 mg by mouth daily. Patient not taking: Reported on 01/26/2024    [provider]  Vitamin D , Ergocalciferol , (DRISDOL ) 1.25 MG (50000 UNIT) CAPS capsule TAKE 1 CAPSULE BY MOUTH ONE TIME PER WEEK 09/17/23   Johnny Garnette LABOR, MD    Allergies: Patient has no known allergies.    Review of Systems  Constitutional:  Negative for chills and fever.  HENT:  Positive for tinnitus. Negative for ear pain and sore throat.   Eyes:  Negative for pain and visual disturbance.  Respiratory:  Negative for cough and shortness of breath.   Cardiovascular:  Negative for chest pain and palpitations.  Gastrointestinal:  Negative for abdominal pain and vomiting.  Genitourinary:  Negative for dysuria and hematuria.  Musculoskeletal:  Negative for arthralgias and back pain.  Skin:  Negative for color change and rash.  Neurological:  Positive for dizziness, syncope (hx of, none recent)  and light-headedness. Negative for seizures.  All other systems reviewed and are negative.   Updated Vital Signs BP (!) 141/77 (BP Location: Right Arm)   Pulse 85   Temp 98.1 F (36.7 C) (Oral)   Resp 17   Ht 5' 5 (1.651 m)   Wt 106.1 kg   SpO2 100%   BMI 38.94 kg/m   Physical Exam Vitals and nursing note reviewed.  Constitutional:      General: She is not in acute distress.    Appearance: She is well-developed.  HENT:     Head: Normocephalic and atraumatic.     Right Ear: Tympanic  membrane, ear canal and external ear normal.     Left Ear: External ear normal. There is impacted cerumen.  Eyes:     General: Lids are normal. Vision grossly intact.     Extraocular Movements: Extraocular movements intact.     Conjunctiva/sclera: Conjunctivae normal.     Pupils: Pupils are equal, round, and reactive to light.  Cardiovascular:     Rate and Rhythm: Normal rate and regular rhythm.     Heart sounds: No murmur heard. Pulmonary:     Effort: Pulmonary effort is normal. No respiratory distress.     Breath sounds: Normal breath sounds.  Abdominal:     Palpations: Abdomen is soft.     Tenderness: There is no abdominal tenderness.  Musculoskeletal:        General: No swelling.     Cervical back: Neck supple.  Skin:    General: Skin is warm and dry.     Capillary Refill: Capillary refill takes less than 2 seconds.  Neurological:     General: No focal deficit present.     Mental Status: She is alert and oriented to person, place, and time.     GCS: GCS eye subscore is 4. GCS verbal subscore is 5. GCS motor subscore is 6.     Cranial Nerves: Cranial nerves 2-12 are intact.     Sensory: Sensation is intact.     Motor: Motor function is intact.     Coordination: Coordination is intact.  Psychiatric:        Mood and Affect: Mood normal.     (all labs ordered are listed, but only abnormal results are displayed) Labs Reviewed  COMPREHENSIVE METABOLIC PANEL WITH GFR - Abnormal; Notable for the following components:      Result Value   Potassium 3.0 (*)    CO2 21 (*)    Glucose, Bld 122 (*)    BUN 24 (*)    Creatinine, Ser 1.29 (*)    GFR, Estimated 45 (*)    All other components within normal limits  URINALYSIS, ROUTINE W REFLEX MICROSCOPIC - Abnormal; Notable for the following components:   Color, Urine AMBER (*)    APPearance CLOUDY (*)    Bilirubin Urine SMALL (*)    Protein, ur 100 (*)    Bacteria, UA MANY (*)    All other components within normal limits  I-STAT  CHEM 8, ED - Abnormal; Notable for the following components:   Potassium 3.2 (*)    BUN 25 (*)    Creatinine, Ser 1.30 (*)    Glucose, Bld 122 (*)    All other components within normal limits  CBC WITH DIFFERENTIAL/PLATELET  ETHANOL    EKG: EKG Interpretation Date/Time:  Wednesday February 18 2024 13:09:00 EDT Ventricular Rate:  81 PR Interval:  142 QRS Duration:  76 QT Interval:  404  QTC Calculation: 469 R Axis:   9  Text Interpretation: Normal sinus rhythm Nonspecific ST and T wave abnormality  Compared with prior EKG from 04/03/2016 Confirmed by Gennaro Bouchard ([id number]) on 02/18/2024 1:19:44 PM  Radiology: MR Brain W and Wo Contrast Result Date: 02/18/2024 EXAM: MRI BRAIN WITH AND WITHOUT CONTRAST 02/18/2024 06:35:42 PM TECHNIQUE: Multiplanar multisequence MRI of the head/brain was performed with and without the administration of intravenous contrast. 10mL gadobutrol  (GADAVIST ) 1 MMOL/ML IV solution was used. COMPARISON: None available. CLINICAL HISTORY: Tinnitus. FINDINGS: BRAIN AND VENTRICLES: No acute infarct. No acute intracranial hemorrhage. No mass effect or midline shift. No hydrocephalus. The sella is unremarkable. Normal flow voids. No mass or abnormal enhancement. Multifocal hyperintense T2-weighted signal within the cerebral white matter, most commonly due to chronic small vessel disease. ORBITS: No acute abnormality. SINUSES: No acute abnormality. BONES AND SOFT TISSUES: Normal bone marrow signal and enhancement. No acute soft tissue abnormality. Internal auditory canals and vestibular structures are normal. No abnormal contrast enhancement of the 7th and 8th cranial nerve complexes. IMPRESSION: 1. No acute intracranial abnormality. 2. Multifocal hyperintense T2-weighted signal within the cerebral white matter, most commonly due to chronic small vessel disease. 3. No abnormal contrast enhancement of the 7 and 8 cranial nerve complexes. Electronically signed by: Franky Stanford MD  02/18/2024 08:26 PM EDT RP Workstation: [id number]   CT Head Wo Contrast Result Date: 02/18/2024 CLINICAL DATA:  Syncope EXAM: CT HEAD WITHOUT CONTRAST TECHNIQUE: Contiguous axial images were obtained from the base of the skull through the vertex without intravenous contrast. RADIATION DOSE REDUCTION: This exam was performed according to the departmental dose-optimization program which includes automated exposure control, adjustment of the mA and/or kV according to patient size and/or use of iterative reconstruction technique. COMPARISON:  None Available. FINDINGS: Brain: No evidence of acute infarction, hemorrhage, hydrocephalus, extra-axial collection or mass lesion/mass effect. Tiny lacunar infarct versus dilated perivascular space and the left basal ganglia. Vascular: No hyperdense vessel or unexpected calcification. Calcifications of the bilateral internal carotid arteries. Skull: Normal. Negative for fracture or focal lesion. Sinuses/Orbits: Mild secretions present within a posterior right ethmoid air cell. The visualized sinuses are otherwise normal. No orbital abnormality. Other: None. IMPRESSION: No acute intracranial abnormality. Electronically Signed   By: Wilkie Lent M.D.   On: 02/18/2024 14:28     Procedures   Medications Ordered in the ED  carbamide peroxide (DEBROX) 6.5 % OTIC (EAR) solution 5 drop (5 drops Left EAR Not Given 02/18/24 2042)  gadobutrol  (GADAVIST ) 1 MMOL/ML injection 10 mL (10 mLs Intravenous Contrast Given 02/18/24 1836)                                    Medical Decision Making Amount and/or Complexity of Data Reviewed Radiology: ordered.  Risk OTC drugs. Prescription drug management.    68 year old female with past medical history of hypertension, GERD, sarcoidosis, and depression presenting for concerns of tinnitus.  Patient also has some symptoms of lightheadedness, dizziness, and headaches.    On exam patient is alert and O x 3, no acute distress,  afebrile, stable vital signs.  No focal neurological deficits.  Blood pressure is mildly elevated at 171/94 in triage.  On recheck she is 141/77.  She does have a history of hypertension and takes amlodipine  and losartan .  She denies any missed doses.  She does admit to concerns for potential alcohol dependence.  We had some conversations  about how this can worsen hypertension and hypertension related symptoms.  She is in no acute distress and is not intoxicated at this time.  She was given resources upon discharge for alcohol rehabilitation as requested.  I ordered an MRI brain with contrast to rule out any cranial tumors potentially causing the tinnitus such as an acoustic neuroma which came back normal.  Her electrolytes and laboratory studies are stable.  I recommend that she follow-up with her primary care physician to complete a proper medication review as many medications have tinnitus as a side effect.  I also recommend that she follows up with an ENT specialist to rule out different types of hearing loss that can result in tinnitus.  This time she is otherwise stable and safe for discharge.  Patient in no distress and overall condition improved here in the ED. Detailed discussions were had with the patient regarding current findings, and need for close f/u with PCP or on call doctor. The patient has been instructed to return immediately if the symptoms worsen in any way for re-evaluation. Patient verbalized understanding and is in agreement with current care plan. All questions answered prior to discharge.      Final diagnoses:  Tinnitus of both ears    ED Discharge Orders     None          Elnor Bernarda SQUIBB, DO 02/18/24 2048

## 2024-02-18 NOTE — ED Provider Triage Note (Signed)
 Emergency Medicine Provider Triage Evaluation Note  Wanisha Shiroma , a 68 y.o. female  was evaluated in triage.  Pt complains of mostly chronic issues, persistent tinnitus, as well as persistent dizziness.  He states he has been going on for several months however what concerns arose that this is not improved, she also endorses having intermittently blurred vision however at presentation does not have any visual deficits.  The patient also endorses intermittent depression along with wishing harm upon herself however she has never developed an active plan and at present does not have any active suicidal ideation.  Review of Systems  Positive: As above Negative:   Physical Exam  BP (!) 142/84 (BP Location: Right Arm)   Pulse (!) 102   Temp 97.7 F (36.5 C)   Resp 18   Ht 5' 5 (1.651 m)   Wt 106.1 kg   SpO2 93%   BMI 38.94 kg/m  Gen:   Awake, no distress   Resp:  Normal effort  MSK:   Moves extremities without difficulty  Other:  Neuroexam is benign  Medical Decision Making  Medically screening exam initiated at 12:46 PM.  Appropriate orders placed.  Zebedee JONETTA Viktoria Beverley was informed that the remainder of the evaluation will be completed by another provider, this initial triage assessment does not replace that evaluation, and the importance of remaining in the ED until their evaluation is complete.  Begin workup for dizziness, syncope.  Further evaluate LFTs secondary to chronic alcohol usage.   Myriam Dorn BROCKS, [place] 02/18/24 1300

## 2024-02-18 NOTE — ED Triage Notes (Signed)
 Pt states she has been having tinnitus for a year and dizziness has been off and on for 4 months. Denies n/v. C/O blurred vision for a day/ numbness in both hands. Axox4. Pt states she has been having balance issues everyday for years.

## 2024-02-18 NOTE — ED Triage Notes (Signed)
 Pt here for ringing in ears for years but dizziness since Sunday after travel. Endorses a heavy feeling in head.

## 2024-02-18 NOTE — ED Notes (Signed)
 Called patient several time for vital update patient didn't answer

## 2024-02-24 ENCOUNTER — Other Ambulatory Visit: Payer: Self-pay | Admitting: Family Medicine

## 2024-04-12 ENCOUNTER — Institutional Professional Consult (permissible substitution) (INDEPENDENT_AMBULATORY_CARE_PROVIDER_SITE_OTHER): Admitting: Physician Assistant

## 2024-04-12 ENCOUNTER — Ambulatory Visit (INDEPENDENT_AMBULATORY_CARE_PROVIDER_SITE_OTHER): Admitting: Audiology

## 2024-05-20 ENCOUNTER — Other Ambulatory Visit (INDEPENDENT_AMBULATORY_CARE_PROVIDER_SITE_OTHER): Payer: Self-pay | Admitting: Physician Assistant

## 2024-05-20 DIAGNOSIS — Z0110 Encounter for examination of ears and hearing without abnormal findings: Secondary | ICD-10-CM

## 2024-05-24 ENCOUNTER — Ambulatory Visit (INDEPENDENT_AMBULATORY_CARE_PROVIDER_SITE_OTHER): Admitting: Physician Assistant

## 2024-05-24 ENCOUNTER — Ambulatory Visit (INDEPENDENT_AMBULATORY_CARE_PROVIDER_SITE_OTHER): Admitting: Audiology

## 2024-05-24 ENCOUNTER — Encounter (INDEPENDENT_AMBULATORY_CARE_PROVIDER_SITE_OTHER): Payer: Self-pay | Admitting: Physician Assistant

## 2024-05-24 VITALS — BP 164/95 | HR 76 | Temp 97.9°F | Ht 65.0 in | Wt 232.0 lb

## 2024-05-24 DIAGNOSIS — H903 Sensorineural hearing loss, bilateral: Secondary | ICD-10-CM | POA: Diagnosis not present

## 2024-05-24 DIAGNOSIS — H9313 Tinnitus, bilateral: Secondary | ICD-10-CM

## 2024-05-24 NOTE — Progress Notes (Signed)
 Patient explains that she did not take her BP medication this morning. She did not want to take BP again because cuff hurt her arm.

## 2024-05-24 NOTE — Progress Notes (Signed)
 Dear Dr. Johnny, Here is my assessment for our mutual patient, Janice Bright. Thank you for allowing me the opportunity to care for your patient. Please do not hesitate to contact me should you have any other questions. Sincerely, Chyrl Cohen PA-C  Otolaryngology Clinic Note Referring provider: Dr. Johnny HPI:  Janice Bright is a 68 y.o. female kindly referred by Dr. Johnny   Discussed the use of AI scribe software for clinical note transcription with the patient, who gave verbal consent to proceed.  History of Present Illness    Janice Bright is a 68 year old female who presents with hearing loss and tinnitus.  She has experienced bilateral tinnitus for over a year, describing it as a high-pitched sound that can be loud at any time of the day or night.  She reports a reduction in hearing, noting difficulty hearing conversations and needing to increase the volume on the television, which her sons have commented is too loud.  She experiences occasional dizziness, particularly when standing up too quickly, which resolves upon sitting down.  She has undergone a comprehensive workup including a hearing test, CT scan, and MRI.  No history of repeated ear infections, ear trauma, or significant noise exposure. No neurologic symptoms such as numbness, tingling, weakness, or slurred speech.         Independent Review of Additional Tests or Records:  Audiological evaluation 05/24/2024  Otoscopy: Right ear: Non-occluding cerumen, able to visualize some tympanic membrane landmarks. Left ear:  Clear external ear canal and notable landmarks visualized on the tympanic membrane.   Tympanometry: Right ear: Type A - Normal external ear canal volume with normal middle ear pressure and normal tympanic membrane compliance. Findings are consistent with normal middle ear function. Left ear: Type A - Normal external ear canal volume with normal middle ear pressure and normal tympanic  membrane compliance. Findings are consistent with normal middle ear function.     Hearing Evaluation The hearing test results were completed under headphones and results are deemed to be of good reliability. Test technique:  conventional     Pure tone Audiometry: Right ear- Normal hearing from [phone], then moderate to moderately severe sensorineural hearing loss from 6000 Hz - 8000 Hz. Left ear-  Normal hearing from [phone] Hz, then moderate to moderately severe sensorineural hearing loss from 6000 Hz - 8000 Hz.   Speech Audiometry: Right ear- Speech Reception Threshold (SRT) was obtained at 5 dBHL. Left ear-Speech Reception Threshold (SRT) was obtained at 5 dBHL.   Word Recognition Score Tested using NU-6 (recorded) Right ear: 100% was obtained at a presentation level of 55 dBHL with contralateral masking which is deemed as  excellent. Left ear: 100% was obtained at a presentation level of 55 dBHL with contralateral masking which is deemed as  excellent.   Impression: There is not a significant difference in pure-tone thresholds between ears. There is not a significant difference in the word recognition score in between ears.    Recommendations: Follow up with ENT as scheduled. Return for a hearing evaluation if concerns with hearing changes arise or per MD recommendation. Consider various tinnitus strategies, including the use of a sound generator, hearing aids, and/or tinnitus retraining therapy.    PMH/Meds/All/SocHx/FamHx/ROS:   Past Medical History:  Diagnosis Date   Anxiety    Colitis    sees Dr. Debrah   Compression, spinal, spondylogenic, cervical    Depression    GERD (gastroesophageal reflux disease)    Gynecological  examination    sees Dr. Sung   Hypertension    Normal eye exam    sees Dr. Sharalyn    Normal spontaneous vaginal delivery    3   Sarcoidosis    withlung involvement, sees Dr. Mozelle     Past Surgical History:  Procedure Laterality  Date   benign cyst removed from the right breast  2009   COLONOSCOPY  02/22/2021   per Dr. Legrand, diverticulosis only, repeat in 10 yrs   OOPHORECTOMY     TOTAL ABDOMINAL HYSTERECTOMY  BSO    Family History  Problem Relation Age of Onset   Heart disease Mother    Diabetes Father    Hypertension Other    Cancer Other        colon   Colon cancer Neg Hx    Colon polyps Neg Hx    Esophageal cancer Neg Hx    Rectal cancer Neg Hx    Stomach cancer Neg Hx      Social Connections: Not on file      Current Outpatient Medications:    amLODipine  (NORVASC ) 5 MG tablet, TAKE 1 TABLET (5 MG TOTAL) BY MOUTH DAILY., Disp: 30 tablet, Rfl: 0   atorvastatin  (LIPITOR) 10 MG tablet, TAKE 1 TABLET BY MOUTH EVERY DAY, Disp: 90 tablet, Rfl: 3   diazepam  (VALIUM ) 5 MG tablet, Take 1 tablet (5 mg total) by mouth every 12 (twelve) hours as needed for anxiety. (Patient not taking: Reported on 01/26/2024), Disp: 30 tablet, Rfl: 0   fluticasone (FLONASE) 50 MCG/ACT nasal spray, Place into both nostrils daily., Disp: , Rfl:    loratadine  (CLARITIN ) 10 MG tablet, , Disp: , Rfl:    losartan  (COZAAR ) 100 MG tablet, TAKE 1 TABLET BY MOUTH EVERY DAY, Disp: 90 tablet, Rfl: 3   methylPREDNISolone  (MEDROL  DOSEPAK) 4 MG TBPK tablet, As directed, Disp: 21 tablet, Rfl: 0   montelukast  (SINGULAIR ) 10 MG tablet, TAKE 1 TABLET BY MOUTH EVERY DAY, Disp: 90 tablet, Rfl: 3   Omega-3 Fatty Acids (PRO NUTRIENTS OMEGA 3 PO), Take 200 mg by mouth daily. (Patient not taking: Reported on 01/26/2024), Disp: , Rfl:    Vitamin D , Ergocalciferol , (DRISDOL ) 1.25 MG (50000 UNIT) CAPS capsule, TAKE 1 CAPSULE BY MOUTH ONE TIME PER WEEK, Disp: 12 capsule, Rfl: 3   Physical Exam:   BP (!) 164/95   Pulse 76   Temp 97.9 F (36.6 C)   Ht 5' 5 (1.651 m)   Wt 232 lb (105.2 kg)   SpO2 96%   BMI 38.61 kg/m   Pertinent Findings  CN II-XII grossly intact Right EAC with cerumen impaction, left EAC clear and TM intact with well pneumatized  middle ear spaces Anterior rhinoscopy: Septum midline; bilateral inferior turbinates with no hypertrophy  No lesions of oral cavity/oropharynx; dentition WNL No obviously palpable neck masses/lymphadenopathy/thyromegaly No respiratory distress or stridor      Seprately Identifiable Procedures:  None  Impression & Plans:  Tavonna Worthington is a 67 y.o. 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 No alarming features. Discussed masking devices and return precautions.   - Use noise machine or fan at night. - Consider referral to tinnitus clinic in [place].  Cerumen impaction, right ear Cerumen impaction in right ear. - Performed cerumen removal.     - f/u PRN   Thank you for allowing me the opportunity to  care for your patient. Please do not hesitate to contact me should you have any other questions.  Sincerely, Chyrl Cohen PA-C [facility] Phone: [phone] Fax: [phone]  05/24/2024, 3:07 PM

## 2024-05-24 NOTE — Progress Notes (Signed)
  [address] [place], [place] [address] [phone]  Audiological Evaluation    Name: Janice Bright     DOB:   10/06/1955      MRN:   [id number]                                                                                     Service Date: 05/24/2024     Accompanied by: self    Patient comes today after Reyes Cohen, PA-C sent a referral for a hearing evaluation due to concerns with tinnitus.   Symptoms Yes Details  Hearing loss  []    Tinnitus  [x]  Both ears - high pitched  Ear pain/ infections/pressure  []    Balance problems  [x]  When standing up too quickly   Noise exposure history  []    Previous ear surgeries  []    Family history of hearing loss  []    Amplification  []    Other  [x]  Sinus problems    Otoscopy: Right ear: Non-occluding cerumen, able to visualize some tympanic membrane landmarks. Left ear:  Clear external ear canal and notable landmarks visualized on the tympanic membrane.  Tympanometry: Right ear: Type A - Normal external ear canal volume with normal middle ear pressure and normal tympanic membrane compliance. Findings are consistent with normal middle ear function. Left ear: Type A - Normal external ear canal volume with normal middle ear pressure and normal tympanic membrane compliance. Findings are consistent with normal middle ear function.   Hearing Evaluation The hearing test results were completed under headphones and results are deemed to be of good reliability. Test technique:  conventional    Pure tone Audiometry: Right ear- Normal hearing from [phone] Hz, then moderate to moderately severe sensorineural hearing loss from 6000 Hz - 8000 Hz. Left ear-  Normal hearing from [phone] Hz, then moderate to moderately severe sensorineural hearing loss from 6000 Hz - 8000 Hz.  Speech Audiometry: Right ear- Speech Reception Threshold (SRT) was obtained at 5 dBHL. Left ear-Speech Reception Threshold (SRT) was obtained at 5  dBHL.   Word Recognition Score Tested using NU-6 (recorded) Right ear: 100% was obtained at a presentation level of 55 dBHL with contralateral masking which is deemed as  excellent. Left ear: 100% was obtained at a presentation level of 55 dBHL with contralateral masking which is deemed as  excellent.   Impression: There is not a significant difference in pure-tone thresholds between ears. There is not a significant difference in the word recognition score in between ears.    Recommendations: Follow up with ENT as scheduled. Return for a hearing evaluation if concerns with hearing changes arise or per MD recommendation. Consider various tinnitus strategies, including the use of a sound generator, hearing aids, and/or tinnitus retraining therapy.    Janice Bright [name], AUD

## 2024-06-24 ENCOUNTER — Other Ambulatory Visit: Payer: Self-pay | Admitting: Family Medicine

## 2024-06-24 NOTE — Telephone Encounter (Signed)
 Contacted pt to set up appt with Dr. Johnny as last office was 06/2022. Pt declined at this time and states she is not at a good location to set up appt. But will call us  back.
